# Patient Record
Sex: Female | Born: 1950
Health system: Southern US, Community
[De-identification: ages and names within clinical notes are randomized; demographics above are authoritative.]

## PROBLEM LIST (undated history)

## (undated) DIAGNOSIS — R32 Unspecified urinary incontinence: Secondary | ICD-10-CM

## (undated) DIAGNOSIS — H409 Unspecified glaucoma: Secondary | ICD-10-CM

## (undated) DIAGNOSIS — I10 Essential (primary) hypertension: Secondary | ICD-10-CM

## (undated) DIAGNOSIS — T7840XA Allergy, unspecified, initial encounter: Secondary | ICD-10-CM

## (undated) DIAGNOSIS — Z5189 Encounter for other specified aftercare: Secondary | ICD-10-CM

## (undated) HISTORY — DX: Unspecified glaucoma: H40.9

## (undated) HISTORY — DX: Essential (primary) hypertension: I10

## (undated) HISTORY — DX: Encounter for other specified aftercare: Z51.89

## (undated) HISTORY — DX: Unspecified urinary incontinence: R32

## (undated) HISTORY — DX: Allergy, unspecified, initial encounter: T78.40XA

## (undated) HISTORY — PX: DILATION AND CURETTAGE OF UTERUS: SHX78

---

## 1996-02-13 DIAGNOSIS — Z5189 Encounter for other specified aftercare: Secondary | ICD-10-CM

## 1996-02-13 HISTORY — DX: Encounter for other specified aftercare: Z51.89

## 1996-02-13 HISTORY — PX: ABDOMINAL HYSTERECTOMY: SHX81

## 2011-02-07 ENCOUNTER — Ambulatory Visit (INDEPENDENT_AMBULATORY_CARE_PROVIDER_SITE_OTHER): Payer: 59

## 2011-02-07 DIAGNOSIS — M7750 Other enthesopathy of unspecified foot: Secondary | ICD-10-CM

## 2011-03-05 ENCOUNTER — Other Ambulatory Visit: Payer: Self-pay

## 2011-04-29 ENCOUNTER — Other Ambulatory Visit: Payer: Self-pay | Admitting: Family Medicine

## 2011-04-29 ENCOUNTER — Ambulatory Visit: Payer: 59

## 2011-04-29 ENCOUNTER — Ambulatory Visit (INDEPENDENT_AMBULATORY_CARE_PROVIDER_SITE_OTHER): Payer: 59 | Admitting: Family Medicine

## 2011-04-29 VITALS — BP 125/71 | HR 83 | Temp 97.7°F | Resp 18

## 2011-04-29 DIAGNOSIS — M25529 Pain in unspecified elbow: Secondary | ICD-10-CM

## 2011-04-29 DIAGNOSIS — M25569 Pain in unspecified knee: Secondary | ICD-10-CM

## 2011-04-29 DIAGNOSIS — I959 Hypotension, unspecified: Secondary | ICD-10-CM

## 2011-04-29 DIAGNOSIS — R110 Nausea: Secondary | ICD-10-CM

## 2011-04-29 MED ORDER — MELOXICAM 15 MG PO TABS: 15.0000 mg | ORAL_TABLET | Freq: Every day | ORAL | Status: DC

## 2011-04-29 MED ORDER — ONDANSETRON 4 MG PO TBDP
4.0000 mg | ORAL_TABLET | Freq: Once | ORAL | Status: AC
  Administered 2011-04-29: 4 mg via ORAL

## 2011-04-29 NOTE — Progress Notes (Signed)
Patient Name: Lisa Blair Date of Birth: Oct 01, 1950 Medical Record Number: [id number] Gender: female Date of Encounter: 04/29/2011  History of Present Illness:  Lisa Blair is a 61 y.o. very pleasant female patient who presents with the following:  She was at her mother's funeral today- she died from alzheimer's disease earlier this week.  On the way into the church she fell and hurt her right knee and right elbow.  She was able to walk although her knee is sore. However, her elbow is very sore and she is afraid it may be broken.    Lisa Blair was brought back urgently due to her very low blood pressure- she felt lightheaded and as though she might pass out while in our lobby. She needed to be brought back in a WC. She has not passed out- her fall was caused by her tripping and was not a syncopal episode.    There is no problem list on file for this patient.  No past medical history on file. No past surgical history on file. History  Substance Use Topics  . Smoking status: Not on file  . Smokeless tobacco: Not on file  . Alcohol Use: Not on file   No family history on file. Allergies  Allergen Reactions  . Advil   . Aleve     Medication list has been reviewed and updated.  Review of Systems: As per HPI- otherwise negative.  Physical Examination: Filed Vitals:   04/29/11 1750  BP: 75/53  Pulse: 71  Temp: 97.7 F (36.5 C)  TempSrc: Oral  Resp: 18  recheck BP: 125/ 71, pulse 84  There is no height or weight on file to calculate BMI.  GEN: WDWN, NAD, Non-toxic, A & O x 3- pale and weak, obese HEENT: Atraumatic, Normocephalic. Neck supple. No masses, No LAD. Ears and Nose: No external deformity. CV: RRR, No M/G/R. No JVD. No thrill. No extra heart sounds. PULM: CTA B, no wheezes, crackles, rhonchi. No retractions. No resp. distress. No accessory muscle use. ABD: S, NT, ND, +BS. No rebound. No HSM. EXTR: No c/c/e NEURO Normal gait.  PSYCH: Normally interactive.  Conversant. Not depressed or anxious appearing.  Calm demeanor.  Right knee:  Bruised and sore, but normal ROM, able to do a straight leg raise.   Right elbow: tender at radial head, not able to fully extend elbow, pain with pronation and supination.    Lisa Blair had not eaten since breakfast- seen at 6pm.  She drank 3 gatorades and ate 2 packs of crackers.  She felt a lot better and her BP came up to 125/71.   EKG: NSR, no ST elevation or depression  Results for orders placed in visit on 04/29/11  GLUCOSE, POCT (MANUAL RESULT ENTRY)      Component Value Range   POC Glucose 125      [facility] reading (PRIMARY) by  Dr. Patsy Lager.  Right knee:  No fracture apparent Right  Elbow: suspect radial head fracture  Assessment and Plan: 1. Nausea  ondansetron (ZOFRAN-ODT) disintegrating tablet 4 mg  2. Hypotension  POCT glucose (manual entry)  3. Knee pain  DG Knee Complete 4 Views Left  4. Elbow pain  DG Elbow Complete Right, meloxicam (MOBIC) 15 MG tablet   Protect elbow as suspect radial head fracture- placed long arm splint and sling.  Hypotensive episode likely due to emotional upset and lack of food- she felt much better and was able to walk easily by the end of her  visit.  Will refer to ortho for further treatment of her elbow.  At this time she feels comfortable going home with her husband- will use mobic (which she has tolerated well in the past) as needed for her pain.

## 2011-04-30 NOTE — Progress Notes (Signed)
Addended by: Abbe Amsterdam C on: 04/30/2011 10:37 AM   Modules accepted: Orders

## 2011-07-03 ENCOUNTER — Encounter: Payer: Self-pay | Admitting: Family Medicine

## 2011-08-10 ENCOUNTER — Other Ambulatory Visit: Payer: Self-pay | Admitting: Family Medicine

## 2011-10-10 ENCOUNTER — Ambulatory Visit (INDEPENDENT_AMBULATORY_CARE_PROVIDER_SITE_OTHER): Payer: 59 | Admitting: Family Medicine

## 2011-10-10 ENCOUNTER — Ambulatory Visit: Payer: 59

## 2011-10-10 VITALS — BP 147/83 | HR 87 | Temp 98.0°F | Resp 16 | Ht 71.5 in | Wt 243.0 lb

## 2011-10-10 DIAGNOSIS — M25569 Pain in unspecified knee: Secondary | ICD-10-CM

## 2011-10-10 DIAGNOSIS — M25529 Pain in unspecified elbow: Secondary | ICD-10-CM

## 2011-10-10 DIAGNOSIS — R030 Elevated blood-pressure reading, without diagnosis of hypertension: Secondary | ICD-10-CM

## 2011-10-10 MED ORDER — MELOXICAM 7.5 MG PO TABS: 7.5000 mg | ORAL_TABLET | Freq: Every day | ORAL | Status: AC

## 2011-10-10 NOTE — Progress Notes (Signed)
  Subjective:    Patient ID: Lisa Blair, female    DOB: 01-20-1951, 61 y.o.   MRN: [id number]  HPI Lisa Blair is a 61 y.o. female  Complains of L knee pain - off and on. Difficulty with exercise.  Pain with exercise -most times, sometimes at night.  No known injury.  Pain in lateral knee - near varicose veins area.  No redness, not swelling usually - but occasional fluid on the knee. Helped today with alleve.  No locking/giving way or  Similar sx's last year - helped when took mobic (has had improvement with both 7.5 and 15mg ).    No hx of htn.   Review of Systems  Constitutional: Negative for fever and chills.  Respiratory: Negative for cough, chest tightness and shortness of breath.   Musculoskeletal: Positive for arthralgias.   As above.  No chest pain, dyspnea. No calf pain.     Objective:   Physical Exam  Constitutional: She is oriented to person, place, and time. She appears well-developed and well-nourished. No distress.  Pulmonary/Chest: Effort normal.  Musculoskeletal:       Left knee: She exhibits normal range of motion, no swelling, no effusion, no LCL laxity, normal patellar mobility and no MCL laxity. tenderness found. Lateral joint line (minimal to lateral jt line. from, negative mcmurray, negative ant drawer.) tenderness noted.  Neurological: She is alert and oriented to person, place, and time.  Skin: Skin is warm and dry. No erythema.  Psychiatric: She has a normal mood and affect. Her behavior is normal.      Assessment & Plan:  Lisa Blair is a 61 y.o. female 1. Knee pain  DG Knee 1-2 Views Left  2. Elbow pain  meloxicam (MOBIC) 7.5 MG tablet  3. Elevated blood pressure reading without diagnosis of hypertension      Intermittent pain - ddx oa flair, vs degen meniscal pathology.  Trial of HEP, pool exercise or exercise bike, mobic 7.5mg  qd prn - watch BP, and remains elevated - rtc.  Ok to refill mobic x 1 in few months if only episodic use needed and blood  pressures ok.   rtc precautions.

## 2011-10-10 NOTE — Progress Notes (Signed)
[facility] reading (PRIMARY) by  Dr. Neva Seat:  Minimal lateral DJD, no acute findings. Marland Kitchen

## 2011-10-10 NOTE — Patient Instructions (Signed)
Try mobic once per day as needed.  Work on exercises as discussed. Recheck in next few weeks if not improving.  Watch your blood pressures outside of the office and return to discuss if they are remaining above 140/90.  Return to the clinic or go to the nearest emergency room if any of your symptoms worsen or new symptoms occur.

## 2011-11-15 ENCOUNTER — Encounter: Payer: Self-pay | Admitting: Family Medicine

## 2011-11-23 ENCOUNTER — Ambulatory Visit (INDEPENDENT_AMBULATORY_CARE_PROVIDER_SITE_OTHER): Payer: 59 | Admitting: Family Medicine

## 2011-11-23 ENCOUNTER — Ambulatory Visit: Payer: 59

## 2011-11-23 VITALS — BP 148/82 | HR 73 | Temp 98.5°F | Resp 16 | Ht 71.5 in | Wt 236.0 lb

## 2011-11-23 DIAGNOSIS — R059 Cough, unspecified: Secondary | ICD-10-CM

## 2011-11-23 DIAGNOSIS — R05 Cough: Secondary | ICD-10-CM

## 2011-11-23 DIAGNOSIS — J45909 Unspecified asthma, uncomplicated: Secondary | ICD-10-CM

## 2011-11-23 MED ORDER — ALBUTEROL SULFATE (2.5 MG/3ML) 0.083% IN NEBU
2.5000 mg | INHALATION_SOLUTION | Freq: Once | RESPIRATORY_TRACT | Status: AC
  Administered 2011-11-23: 2.5 mg via RESPIRATORY_TRACT

## 2011-11-23 MED ORDER — PREDNISONE 20 MG PO TABS: ORAL_TABLET | ORAL | Status: DC

## 2011-11-23 MED ORDER — ALBUTEROL SULFATE HFA 108 (90 BASE) MCG/ACT IN AERS: 2.0000 | INHALATION_SPRAY | Freq: Four times a day (QID) | RESPIRATORY_TRACT | Status: DC | PRN

## 2011-11-23 MED ORDER — ALBUTEROL SULFATE (2.5 MG/3ML) 0.083% IN NEBU: 2.5000 mg | INHALATION_SOLUTION | Freq: Once | RESPIRATORY_TRACT | Status: DC

## 2011-11-23 NOTE — Progress Notes (Signed)
Subjective

## 2011-11-23 NOTE — Patient Instructions (Signed)
   The albuterol inhaler as directed    Continue the Mucinex    Drink plenty of fluids    Take the prednisone taper as directed    Return if worse

## 2011-11-24 NOTE — Progress Notes (Signed)
[facility] reading (PRIMARY) by  Dr. Katrinka Blazing for Dr. Alwyn Ren.  CXR:  NAD.

## 2011-12-25 ENCOUNTER — Telehealth: Payer: Self-pay

## 2011-12-25 NOTE — Telephone Encounter (Addendum)
It appears that Eula Listen, PA-C ordered this medication in June #30 with 3 RFs . Yes, we can refill it and I will authorize - #90 with 1 refill ( sig: 1/2 tab hs or 1/2 tab bid for bladder symptoms).

## 2011-12-25 NOTE — Telephone Encounter (Signed)
Recd call from [employer] [facility]. She needs to verify some information on a script from dr mcpherson Please call [facility] at [phone] and use reference number [id number]

## 2011-12-25 NOTE — Telephone Encounter (Signed)
Called [facility] back and they want to know if we can RF pt's oxybutynin. They do not have the oringinal Rx bc it was sent to the local pharmacy, but pt has requested it to be filled by them. They show that Dr Audria Nine had Rxd #90 w/4 RFs on 12/12/11 but then order was discontinued. Dr Audria Nine, do you want pt on this medication and can we RF it, how many RFs? It looks like pt has seen several of our MDs more recently ( only for acute problems) but you saw her for her CPE last Nov 16, 2010. I'm bringing pt's paper chart to 104.

## 2011-12-26 ENCOUNTER — Ambulatory Visit (INDEPENDENT_AMBULATORY_CARE_PROVIDER_SITE_OTHER): Payer: 59 | Admitting: Family Medicine

## 2011-12-26 ENCOUNTER — Encounter: Payer: Self-pay | Admitting: Family Medicine

## 2011-12-26 VITALS — BP 126/86 | HR 79 | Temp 98.6°F | Resp 16 | Ht 71.75 in | Wt 236.0 lb

## 2011-12-26 DIAGNOSIS — Z0000 Encounter for general adult medical examination without abnormal findings: Secondary | ICD-10-CM

## 2011-12-26 DIAGNOSIS — J45909 Unspecified asthma, uncomplicated: Secondary | ICD-10-CM

## 2011-12-26 DIAGNOSIS — Z23 Encounter for immunization: Secondary | ICD-10-CM

## 2011-12-26 DIAGNOSIS — Z862 Personal history of diseases of the blood and blood-forming organs and certain disorders involving the immune mechanism: Secondary | ICD-10-CM

## 2011-12-26 LAB — POCT URINALYSIS DIPSTICK
Bilirubin, UA: NEGATIVE
Leukocytes, UA: NEGATIVE
Nitrite, UA: NEGATIVE
Protein, UA: NEGATIVE
pH, UA: 5.5

## 2011-12-26 LAB — COMPREHENSIVE METABOLIC PANEL
AST: 19 U/L (ref 0–37)
Albumin: 4.4 g/dL (ref 3.5–5.2)
BUN: 11 mg/dL (ref 6–23)
CO2: 25 mEq/L (ref 19–32)
Calcium: 9.4 mg/dL (ref 8.4–10.5)
Chloride: 107 mEq/L (ref 96–112)
Glucose, Bld: 96 mg/dL (ref 70–99)
Potassium: 4.6 mEq/L (ref 3.5–5.3)

## 2011-12-26 LAB — CBC WITH DIFFERENTIAL/PLATELET
Basophils Absolute: 0 10*3/uL (ref 0.0–0.1)
Basophils Relative: 0 % (ref 0–1)
Hemoglobin: 14.5 g/dL (ref 12.0–15.0)
MCHC: 34.2 g/dL (ref 30.0–36.0)
Monocytes Relative: 8 % (ref 3–12)
Neutro Abs: 4.5 10*3/uL (ref 1.7–7.7)
Neutrophils Relative %: 65 % (ref 43–77)
RDW: 13.4 % (ref 11.5–15.5)
WBC: 7 10*3/uL (ref 4.0–10.5)

## 2011-12-26 MED ORDER — MOMETASONE FUROATE 50 MCG/ACT NA SUSP: 2.0000 | Freq: Every day | NASAL | Status: DC

## 2011-12-26 MED ORDER — OXYBUTYNIN CHLORIDE 5 MG PO TABS: ORAL_TABLET | ORAL | Status: DC

## 2011-12-26 MED ORDER — MONTELUKAST SODIUM 10 MG PO TABS: 10.0000 mg | ORAL_TABLET | Freq: Every day | ORAL | Status: DC

## 2011-12-26 NOTE — Patient Instructions (Signed)
Keeping You Healthy  Get These Tests  Blood Pressure- Have your blood pressure checked by your healthcare provider at least once a year.  Normal blood pressure is 120/80.  Weight- Have your body mass index (BMI) calculated to screen for obesity.  BMI is a measure of body fat based on height and weight.  You can calculate your own BMI at www.nhlbisupport.com/bmi/  Cholesterol- Have your cholesterol checked every year.  Diabetes- Have your blood sugar checked every year if you have high blood pressure, high cholesterol, a family history of diabetes or if you are overweight.  Pap Smear- Have a pap smear every 1 to 3 years if you have been sexually active.  If you are older than 65 and recent pap smears have been normal you may not need additional pap smears.  In addition, if you have had a hysterectomy  For benign disease additional pap smears are not necessary.  Mammogram-Yearly mammograms are essential for early detection of breast cancer  Screening for Colon Cancer- Colonoscopy starting at age 50. Screening may begin sooner depending on your family history and other health conditions.  Follow up colonoscopy as directed by your Gastroenterologist.  Screening for Osteoporosis- Screening begins at age 65 with bone density scanning, sooner if you are at higher risk for developing Osteoporosis.  Get these medicines  Calcium with Vitamin D- Your body requires 1200-1500 mg of Calcium a day and 800-1000 IU of Vitamin D a day.  You can only absorb 500 mg of Calcium at a time therefore Calcium must be taken in 2 or 3 separate doses throughout the day.  Hormones- Hormone therapy has been associated with increased risk for certain cancers and heart disease.  Talk to your healthcare provider about if you need relief from menopausal symptoms.  Aspirin- Ask your healthcare provider about taking Aspirin to prevent Heart Disease and Stroke.  Get these Immuniztions  Flu shot- Every fall  Pneumonia  shot- Once after the age of 62; if you are younger ask your healthcare provider if you need a pneumonia shot.  Tetanus- Every ten years.  Zostavax- Once after the age of 62 to prevent shingles.  Take these steps  Don't smoke- Your healthcare provider can help you quit. For tips on how to quit, ask your healthcare provider or go to www.smokefree.gov or call 1-800 QUIT-NOW.  Be physically active- Exercise 5 days a week for a minimum of 30 minutes.  If you are not already physically active, start slow and gradually work up to 30 minutes of moderate physical activity.  Try walking, dancing, bike riding, swimming, etc.  Eat a healthy diet- Eat a variety of healthy foods such as fruits, vegetables, whole grains, low fat milk, low fat cheeses, yogurt, lean meats, chicken, fish, eggs, dried beans, tofu, etc.  For more information go to www.thenutritionsource.org  Dental visit- Brush and floss teeth twice daily; visit your dentist twice a year.  Eye exam- Visit your Optometrist or Ophthalmologist yearly.  Drink alcohol in moderation- Limit alcohol intake to one drink or less a day.  Never drink and drive.  Depression- Your emotional health is as important as your physical health.  If you're feeling down or losing interest in things you normally enjoy, please talk to your healthcare provider.  Seat Belts- can save your life; always wear one  Smoke/Carbon Monoxide detectors- These detectors need to be installed on the appropriate level of your home.  Replace batteries at least once a year.  Violence- If anyone   is threatening or hurting you, please tell your healthcare provider.  Living Will/ Health care power of attorney- Discuss with your healthcare provider and family.   Urinary Incontinence Your doctor wants you to have this information about urinary incontinence. This is the inability to keep urine in your body until you decide to release it. CAUSES  Prostate gland enlargement is a common  cause of urinary incontinence. But there are many different causes for losing urinary control. They include:  Medicines.  Infections.  Prostate problems.  Surgery.  Neurological diseases.  Emotional factors. DIAGNOSIS  Evaluating the cause of incontinence is important in choosing the best treatment. This may require:  An ultrasound exam.  Kidney and bladder X-rays.  Cystoscopy. This is an exam of the bladder using a narrow scope. TREATMENT  For incontinent patients, normal daily hygiene and using changing pads or adult diapers regularly will prevent offensive odors and skin damage from the moisture. Changing your medicines may help control incontinence. Your caregiver may prescribe some medicines to help you regain control. Avoid caffeine. It can over-stimulate the bladder. Use the bathroom regularly. Try about every 2 to 3 hours even if you do not feel the need. Take time to empty your bladder completely. After urinating, wait a minute. Then try to urinate again. External devices used to catch urine or an indwelling urine catheter (Foley catheter) may be needed as well. Some prostate gland problems require surgery to correct. Call your caregiver for more information. Document Released: 03/08/2004 Document Revised: 04/23/2011 Document Reviewed: 03/03/2008 [facility] Patient Information 2013 [place], [place].   Allergic Rhinitis Allergic rhinitis is when the mucous membranes in the nose respond to allergens. Allergens are particles in the air that cause your body to have an allergic reaction. This causes you to release allergic antibodies. Through a chain of events, these eventually cause you to release histamine into the blood stream (hence the use of antihistamines). Although meant to be protective to the body, it is this release that causes your discomfort, such as frequent sneezing, congestion and an itchy runny nose.  CAUSES  The pollen allergens may come from grasses, trees, and  weeds. This is seasonal allergic rhinitis, or "hay fever." Other allergens cause year-round allergic rhinitis (perennial allergic rhinitis) such as house dust mite allergen, pet dander and mold spores.  SYMPTOMS   Nasal stuffiness (congestion).  Runny, itchy nose with sneezing and tearing of the eyes.  There is often an itching of the mouth, eyes and ears. It cannot be cured, but it can be controlled with medications. DIAGNOSIS  If you are unable to determine the offending allergen, skin or blood testing may find it. TREATMENT   Avoid the allergen.  Medications and allergy shots (immunotherapy) can help.  Hay fever may often be treated with antihistamines in pill or nasal spray forms. Antihistamines block the effects of histamine. There are over-the-counter medicines that may help with nasal congestion and swelling around the eyes. Check with your caregiver before taking or giving this medicine. If the treatment above does not work, there are many new medications your caregiver can prescribe. Stronger medications may be used if initial measures are ineffective. Desensitizing injections can be used if medications and avoidance fails. Desensitization is when a patient is given ongoing shots until the body becomes less sensitive to the allergen. Make sure you follow up with your caregiver if problems continue. SEEK MEDICAL CARE IF:   You develop fever (more than 100.5 F (38.1 C).  You develop a  cough that does not stop easily (persistent).  You have shortness of breath.  You start wheezing.  Symptoms interfere with normal daily activities. Document Released: 10/24/2000 Document Revised: 04/23/2011 Document Reviewed: 05/05/2008 [facility] Patient Information 2013 [place], [place].   I think your cough is allergy related and I have prescribed some medication to help reduce those symptoms ( a steroid nasal spray and a medication for asthma/allergies- not an antihistamine).

## 2011-12-26 NOTE — Progress Notes (Signed)
Subjective:    Patient ID: Lisa Blair, female    DOB: [birth year], 61 y.o.   MRN: [id number]  HPI  This 61 y.o. Cauc female is here for CPE. Chronic problems include Allergic bronchitis and   urinary frequency which are controlled on current medications. She seldom has rhinorrhea or  postnasal drip. COugh is productive of thick white sputum; these symptoms are exacerbated  by exposure to dampness, especially if she works in the garden during summer months.   Pt is  Married and is not working (retired). Averages 2 glasses of wine/week; no tobacco use.   HCM: PAP-UTD per pt (abnl- followed by GYN)            MMG- UTD per pt (abnl- followed by GYN)            DEXA- 2006- normal            CRS- 2006- normal.    Review of Systems  Constitutional: Negative.   Eyes: Negative.   Respiratory: Positive for cough and wheezing.   Gastrointestinal: Negative.   Genitourinary: Positive for urgency and frequency.  Musculoskeletal: Negative.   Skin: Negative.   Neurological: Positive for headaches.  Hematological: Negative.   Psychiatric/Behavioral: Negative.        Objective:   Physical Exam  Nursing note and vitals reviewed. Constitutional: She is oriented to person, place, and time. She appears well-developed and well-nourished. No distress.  HENT:  Head: Normocephalic and atraumatic.  Right Ear: Hearing, tympanic membrane, external ear and ear canal normal.  Left Ear: Hearing, tympanic membrane, external ear and ear canal normal.  Nose: Nose normal. No mucosal edema, rhinorrhea, nasal deformity or septal deviation. Right sinus exhibits no maxillary sinus tenderness and no frontal sinus tenderness. Left sinus exhibits no maxillary sinus tenderness and no frontal sinus tenderness.  Mouth/Throat: Uvula is midline and mucous membranes are normal. No oral lesions. Normal dentition. No dental caries. Posterior oropharyngeal erythema present. No oropharyngeal exudate or posterior oropharyngeal  edema.  Eyes: Conjunctivae normal, EOM and lids are normal. Pupils are equal, round, and reactive to light. No scleral icterus.  Fundoscopic exam:      The right eye shows red reflex.      The left eye shows red reflex. Neck: Normal range of motion. Neck supple. No thyromegaly present.  Cardiovascular: Normal rate, regular rhythm, normal heart sounds and intact distal pulses.  Exam reveals no gallop and no friction rub.   No murmur heard. Pulmonary/Chest: Effort normal and breath sounds normal. No respiratory distress. She has no wheezes. She has no rales. Right breast exhibits no inverted nipple, no mass, no nipple discharge, no skin change and no tenderness. Left breast exhibits no inverted nipple, no mass, no nipple discharge, no skin change and no tenderness. Breasts are symmetrical.  Abdominal: Soft. Normal aorta and bowel sounds are normal. She exhibits no distension, no abdominal bruit, no pulsatile midline mass and no mass. There is no hepatosplenomegaly. There is no tenderness. There is no guarding and no CVA tenderness. No hernia.  Genitourinary: Rectal exam shows external hemorrhoid. Rectal exam shows no fissure, no mass, no tenderness and anal tone normal. Guaiac positive stool.  Musculoskeletal: Normal range of motion. She exhibits no edema and no tenderness.  Lymphadenopathy:    She has no cervical adenopathy.  Neurological: She is alert and oriented to person, place, and time. She has normal reflexes. No cranial nerve deficit. She exhibits normal muscle tone. Coordination normal.  Skin: Skin is  warm and dry. No rash noted. No erythema. No pallor.  Psychiatric: She has a normal mood and affect. Her behavior is normal. Judgment and thought content normal.          Assessment & Plan:   1. Routine general medical examination at a health care facility  IFOBT POC (occult bld, rslt in office)- Positive (possibly due to large hemorrhoids); pt given hemosure to do at home and return to  office. Colonoscopy is due in 2014.  Comprehensive metabolic panel, POCT urinalysis dipstick  2. Allergic bronchitis  Continue current medications  3. History of anemia  CBC with Differential  4. Need for prophylactic vaccination and inoculation against influenza  Flu vaccine greater than or equal to 3yo preservative free IM

## 2011-12-26 NOTE — Telephone Encounter (Signed)
Sent Rx to [facility]

## 2011-12-27 ENCOUNTER — Encounter: Payer: Self-pay | Admitting: Radiology

## 2011-12-27 NOTE — Progress Notes (Signed)
Quick Note:  Please notify pt that results are normal.   Provide pt with copy of labs. ______ 

## 2011-12-31 ENCOUNTER — Encounter: Payer: Self-pay | Admitting: Family Medicine

## 2011-12-31 DIAGNOSIS — R3915 Urgency of urination: Secondary | ICD-10-CM | POA: Insufficient documentation

## 2011-12-31 DIAGNOSIS — Z862 Personal history of diseases of the blood and blood-forming organs and certain disorders involving the immune mechanism: Secondary | ICD-10-CM | POA: Insufficient documentation

## 2011-12-31 DIAGNOSIS — Z87898 Personal history of other specified conditions: Secondary | ICD-10-CM | POA: Insufficient documentation

## 2011-12-31 DIAGNOSIS — J45909 Unspecified asthma, uncomplicated: Secondary | ICD-10-CM | POA: Insufficient documentation

## 2011-12-31 DIAGNOSIS — Z8742 Personal history of other diseases of the female genital tract: Secondary | ICD-10-CM | POA: Insufficient documentation

## 2011-12-31 DIAGNOSIS — E669 Obesity, unspecified: Secondary | ICD-10-CM | POA: Insufficient documentation

## 2012-02-08 ENCOUNTER — Ambulatory Visit (INDEPENDENT_AMBULATORY_CARE_PROVIDER_SITE_OTHER): Payer: 59 | Admitting: Family Medicine

## 2012-02-08 ENCOUNTER — Encounter: Payer: Self-pay | Admitting: Family Medicine

## 2012-02-08 VITALS — BP 130/83 | HR 97 | Temp 97.1°F | Resp 16 | Ht 70.5 in | Wt 235.0 lb

## 2012-02-08 DIAGNOSIS — J209 Acute bronchitis, unspecified: Secondary | ICD-10-CM

## 2012-02-08 MED ORDER — AMOXICILLIN 500 MG PO CAPS: ORAL_CAPSULE | ORAL | Status: DC

## 2012-02-08 NOTE — Patient Instructions (Addendum)
Acute Bronchitis You have acute bronchitis. This means you have a chest cold. The airways in your lungs are red and sore (inflamed). Acute means it is sudden onset.  CAUSES Bronchitis is most often caused by the same virus that causes a cold. SYMPTOMS   Body aches.  Chest congestion.  Chills.  Cough.  Fever.  Shortness of breath.  Sore throat. TREATMENT  Acute bronchitis is usually treated with rest, fluids, and medicines for relief of fever or cough. Most symptoms should go away after a few days or a week. Increased fluids may help thin your secretions and will prevent dehydration. Your caregiver may give you an inhaler to improve your symptoms. The inhaler reduces shortness of breath and helps control cough. You can take over-the-counter pain relievers or cough medicine to decrease coughing, pain, or fever. A cool-air vaporizer may help thin bronchial secretions and make it easier to clear your chest. Antibiotics are usually not needed but can be prescribed if you smoke, are seriously ill, have chronic lung problems, are elderly, or you are at higher risk for developing complications.Allergies and asthma can make bronchitis worse. Repeated episodes of bronchitis may cause longstanding lung problems. Avoid smoking and secondhand smoke.Exposure to cigarette smoke or irritating chemicals will make bronchitis worse. If you are a cigarette smoker, consider using nicotine gum or skin patches to help control withdrawal symptoms. Quitting smoking will help your lungs heal faster. Recovery from bronchitis is often slow, but you should start feeling better after 2 to 3 days. Cough from bronchitis frequently lasts for 3 to 4 weeks. To prevent another bout of acute bronchitis:  Quit smoking.  Wash your hands frequently to get rid of viruses or use a hand sanitizer.  Avoid other people with cold or virus symptoms.  Try not to touch your hands to your mouth, nose, or eyes. SEEK IMMEDIATE  MEDICAL CARE IF:  You develop increased fever, chills, or chest pain.  You have severe shortness of breath or bloody sputum.  You develop dehydration, fainting, repeated vomiting, or a severe headache.  You have no improvement after 1 week of treatment or you get worse. MAKE SURE YOU:   Understand these instructions.  Will watch your condition.  Will get help right away if you are not doing well or get worse. Document Released: 03/08/2004 Document Revised: 04/23/2011 Document Reviewed: 05/24/2010 [facility] Patient Information 2013 [place], [place].   You had a chest xray in October which was normal. If these symptoms do not improve, return for re-evaluation and a repeat xray. Also, a humidifier in your bedroom and anywhere else in the home where the air may be dry could reduce your symptoms.

## 2012-02-08 NOTE — Progress Notes (Signed)
S:  This 61 y.o. Cauc female is here for evaluation of cough which started > 1week ago. She and her husband travelled to [place], [place] for business. She had RX: Amoxicillin to take "in case a bad tooth started to abscess". She started taking the antibiotic 1 week ago and felt better within 5 days. She ran out of medication because husband took 5 of her capsules for an infected cut on his hand. She was also exposed to family members who had "colds". Her cough, which is productive, is not associated w/ fever, chest pain or SOB. She has not really been using Albuterol MDI (it seems to make her cough worse).  She will be travelling to [place] on Feb 15, 2012 and "will recuperate on the beach in the warm salty air".  PMHx and Soc Hx reviewed.  O:  Filed Vitals:   02/08/12 1425  BP: 130/83  Pulse: 97  Temp: 97.1 F (36.2 C)  Resp: 16   GEN: In NAD; WN,WD. HEENT: [place]/AT. EOMI w/ clear conj and sclerae. EACs normal. TMs dull. Post ph erythematous w/o lesions. Nasal mucosa w/o edema or rhinorrhea. NECK: Supple w/o LAN or TMG. COR: RRR; no m/g/r. No edema. LUNGS: CTA w/o wheezes or rhonchi. SKIN: W&D; no rashes or pallor. NEURO: A&O x 3; CNs intact. Nonfocal.  Xray review: CXR in Oct 2013- no active cardiopulmonary disease.  A/P:  1. Bronchitis, acute      RX: Amoxicillin 500 mg  2 capsules bid for 7 days. Take other prescription meds as directed.  Continue other measures and OTC medication for cough. Consider using a humidifier in home.

## 2012-04-26 ENCOUNTER — Other Ambulatory Visit: Payer: Self-pay | Admitting: Family Medicine

## 2012-08-29 ENCOUNTER — Other Ambulatory Visit: Payer: Self-pay | Admitting: Physician Assistant

## 2013-01-07 ENCOUNTER — Other Ambulatory Visit: Payer: Self-pay

## 2013-01-07 MED ORDER — OXYBUTYNIN CHLORIDE 5 MG PO TABS: ORAL_TABLET | ORAL | Status: DC

## 2013-03-06 ENCOUNTER — Other Ambulatory Visit: Payer: Self-pay | Admitting: Family Medicine

## 2013-03-20 ENCOUNTER — Ambulatory Visit (INDEPENDENT_AMBULATORY_CARE_PROVIDER_SITE_OTHER): Payer: 59 | Admitting: Family Medicine

## 2013-03-20 ENCOUNTER — Encounter: Payer: Self-pay | Admitting: Family Medicine

## 2013-03-20 VITALS — BP 144/80 | HR 64 | Temp 98.4°F | Resp 16 | Ht 71.0 in | Wt 236.2 lb

## 2013-03-20 DIAGNOSIS — Z1212 Encounter for screening for malignant neoplasm of rectum: Secondary | ICD-10-CM

## 2013-03-20 DIAGNOSIS — E669 Obesity, unspecified: Secondary | ICD-10-CM

## 2013-03-20 DIAGNOSIS — M7632 Iliotibial band syndrome, left leg: Secondary | ICD-10-CM

## 2013-03-20 DIAGNOSIS — Z1211 Encounter for screening for malignant neoplasm of colon: Secondary | ICD-10-CM

## 2013-03-20 DIAGNOSIS — Z0000 Encounter for general adult medical examination without abnormal findings: Secondary | ICD-10-CM

## 2013-03-20 DIAGNOSIS — M629 Disorder of muscle, unspecified: Secondary | ICD-10-CM

## 2013-03-20 DIAGNOSIS — E559 Vitamin D deficiency, unspecified: Secondary | ICD-10-CM

## 2013-03-20 LAB — POCT URINALYSIS DIPSTICK
Bilirubin, UA: NEGATIVE
Glucose, UA: NEGATIVE
Ketones, UA: NEGATIVE
Leukocytes, UA: NEGATIVE
Nitrite, UA: NEGATIVE
Protein, UA: NEGATIVE
Spec Grav, UA: 1.025
Urobilinogen, UA: 0.2
pH, UA: 5.5

## 2013-03-20 LAB — CBC WITH DIFFERENTIAL/PLATELET
Basophils Absolute: 0 K/uL (ref 0.0–0.1)
Basophils Relative: 0 % (ref 0–1)
Eosinophils Absolute: 0.1 K/uL (ref 0.0–0.7)
Eosinophils Relative: 2 % (ref 0–5)
HCT: 40.5 % (ref 36.0–46.0)
Hemoglobin: 13.8 g/dL (ref 12.0–15.0)
Lymphocytes Relative: 28 % (ref 12–46)
Lymphs Abs: 1.7 K/uL (ref 0.7–4.0)
MCH: 29.5 pg (ref 26.0–34.0)
MCHC: 34.1 g/dL (ref 30.0–36.0)
MCV: 86.5 fL (ref 78.0–100.0)
Monocytes Absolute: 0.4 K/uL (ref 0.1–1.0)
Monocytes Relative: 7 % (ref 3–12)
Neutro Abs: 3.6 K/uL (ref 1.7–7.7)
Neutrophils Relative %: 63 % (ref 43–77)
Platelets: 190 K/uL (ref 150–400)
RBC: 4.68 MIL/uL (ref 3.87–5.11)
RDW: 13.4 % (ref 11.5–15.5)
WBC: 5.8 K/uL (ref 4.0–10.5)

## 2013-03-20 LAB — IFOBT (OCCULT BLOOD): IFOBT: NEGATIVE

## 2013-03-20 MED ORDER — OXYBUTYNIN CHLORIDE 5 MG PO TABS: ORAL_TABLET | ORAL | Status: DC

## 2013-03-20 NOTE — Progress Notes (Signed)
Subjective:    Patient ID: Lisa Blair, female    DOB: 27-Jan-1951, 63 y.o.   MRN: [id number]  HPI  This 63 y.o. Cauc female is here for CPE. She had TAH in 1998 for benign reasons. She has no complaints re: pelvic pain or GI symptoms.  HCM: CRS- Due in 2016.           MMG- Nov 2013 (negative).           IMM- Current.           DEXA- Has not had.  Patient Active Problem List   Diagnosis Date Noted  . Allergic bronchitis 12/31/2011  . History of anemia 12/31/2011  . History of abnormal cervical Pap smear 12/31/2011  . History of abnormal mammogram 12/31/2011  . Obesity (BMI 30.0-34.9) 12/31/2011  . Urinary urgency 12/31/2011   Prior to Admission medications   Medication Sig Start Date End Date Taking? Authorizing Provider  aspirin 81 MG tablet Take 81 mg by mouth daily.   Yes Historical Provider, MD  Cholecalciferol (VITAMIN D PO) Take by mouth daily.   Yes Historical Provider, MD  Multiple Vitamin (MULTIVITAMIN) tablet Take 1 tablet by mouth daily.   Yes Historical Provider, MD  oxybutynin (DITROPAN) 5 MG tablet TAKE 1/2 TABLET BY MOUTH AT BEDTIME OR 1/2 TABLET TWICE A DAY FOR BLADDER. 03/20/13  Yes Barton Fanny, MD  albuterol (PROVENTIL HFA;VENTOLIN HFA) 108 (90 BASE) MCG/ACT inhaler Inhale 2 puffs into the lungs every 6 (six) hours as needed for wheezing. 11/23/11   Posey Boyer, MD    PMHx, Surg Hx, Soc and [place]in [place] reviewed.   Review of Systems  Constitutional: Negative.   HENT: Positive for nosebleeds.        Hx of nosebleeds but much less often now.  Eyes: Negative.        Plans to sch follow-up w/ specialist.  Respiratory: Negative.   Cardiovascular: Negative.   Gastrointestinal: Negative.   Endocrine: Negative.   Genitourinary: Positive for urgency and frequency.       Chronic urinary incontinence; Oxybutynin is effective.  Musculoskeletal: Positive for arthralgias. Negative for back pain, gait problem, joint swelling and myalgias.       L knee- lateral  aspect has intermittent tenderness w/o redness or swelling.  Skin: Negative.   Allergic/Immunologic: Negative.   Neurological: Positive for headaches. Negative for dizziness, syncope, facial asymmetry, speech difficulty, weakness and numbness.       Infrequent migraines.  Hematological: Negative.   Psychiatric/Behavioral: Negative.        Objective:   Physical Exam  Nursing note and vitals reviewed. Constitutional: She is oriented to person, place, and time. Vital signs are normal. She appears well-developed and well-nourished. No distress.  HENT:  Head: Normocephalic and atraumatic.  Right Ear: Hearing, tympanic membrane, external ear and ear canal normal.  Left Ear: Hearing, tympanic membrane, external ear and ear canal normal.  Nose: Nose normal. No mucosal edema, rhinorrhea, nasal deformity or septal deviation.  Mouth/Throat: Uvula is midline, oropharynx is clear and moist and mucous membranes are normal. No oral lesions. Normal dentition.  Eyes: Conjunctivae, EOM and lids are normal. Pupils are equal, round, and reactive to light. No scleral icterus.  Fundoscopic exam:      The right eye shows no papilledema. The right eye shows red reflex.       The left eye shows no papilledema. The left eye shows red reflex.  Neck: Trachea normal, normal range of  motion and full passive range of motion without pain. Neck supple. No JVD present. No spinous process tenderness and no muscular tenderness present. Carotid bruit is not present. No mass and no thyromegaly present.  Cardiovascular: Normal rate, regular rhythm, S1 normal, S2 normal, normal heart sounds and normal pulses.   No extrasystoles are present. PMI is not displaced.  Exam reveals no gallop and no friction rub.   No murmur heard. Pulmonary/Chest: Effort normal and breath sounds normal. No respiratory distress. She has no decreased breath sounds. She has no wheezes. She has no rhonchi. She has no rales. Right breast exhibits no  inverted nipple, no mass, no nipple discharge, no skin change and no tenderness. Left breast exhibits no inverted nipple, no mass, no nipple discharge, no skin change and no tenderness. Breasts are symmetrical.  Abdominal: Soft. Normal appearance and bowel sounds are normal. She exhibits no distension, no abdominal bruit, no pulsatile midline mass and no mass. There is no hepatosplenomegaly. There is no tenderness. There is no guarding and no CVA tenderness. No hernia.  Genitourinary: Rectum normal. Rectal exam shows no external hemorrhoid, no fissure, no mass, no tenderness and anal tone normal. Guaiac negative stool.  NEFG; pelvic/ PAP deferred.  Musculoskeletal: Normal range of motion. She exhibits no edema and no tenderness.  Pt indicates tender area on lateral aspect of L knee- no abnormality found on exam.  Lymphadenopathy:       Head (right side): No submental, no submandibular, no tonsillar, no posterior auricular and no occipital adenopathy present.       Head (left side): No submental, no submandibular, no tonsillar, no posterior auricular and no occipital adenopathy present.    She has no cervical adenopathy.    She has no axillary adenopathy.       Right: No inguinal and no supraclavicular adenopathy present.       Left: No inguinal and no supraclavicular adenopathy present.  Neurological: She is alert and oriented to person, place, and time. She has normal strength and normal reflexes. She displays no atrophy and no tremor. No cranial nerve deficit or sensory deficit. She exhibits normal muscle tone. She displays a negative Romberg sign. Coordination and gait normal.  Skin: Skin is warm, dry and intact. No ecchymosis, no lesion, no petechiae and no rash noted. She is not diaphoretic. No cyanosis or erythema. No pallor. Nails show no clubbing.  Psychiatric: She has a normal mood and affect. Her speech is normal and behavior is normal. Judgment and thought content normal. Cognition and  memory are normal.    Results for orders placed in visit on 03/20/13  IFOBT (OCCULT BLOOD)      Result Value Range   IFOBT Negative    POCT URINALYSIS DIPSTICK      Result Value Range   Color, UA yellow     Clarity, UA clear     Glucose, UA neg     Bilirubin, UA neg     Ketones, UA neg     Spec Grav, UA 1.025     Blood, UA tr-intact     pH, UA 5.5     Protein, UA neg     Urobilinogen, UA 0.2     Nitrite, UA neg     Leukocytes, UA Negative         Assessment & Plan:  Routine general medical examination at a health care facility - Plan: IFOBT POC (occult bld, rslt in office), TSH, POCT urinalysis dipstick, T3, Free, T4,  Free, COMPLETE METABOLIC PANEL WITH GFR, Lipid panel, CBC with Differential  Iliotibial band syndrome of left side  Unspecified vitamin D deficiency - Plan: Vit D  25 hydroxy (rtn osteoporosis monitoring)  Screening for colorectal cancer - Plan: Ambulatory referral to Gastroenterology, IFOBT POC (occult bld, rslt in office)  Obesity (BMI 30.0-34.9)  Meds ordered this encounter  Medications  . oxybutynin (DITROPAN) 5 MG tablet    Sig: TAKE 1/2 TABLET BY MOUTH AT BEDTIME OR 1/2 TABLET TWICE A DAY FOR BLADDER.    Dispense:  30 tablet    Refill:  11

## 2013-03-20 NOTE — Patient Instructions (Signed)
Keeping You Healthy  Get These Tests  Blood Pressure- Have your blood pressure checked by your healthcare provider at least once a year.  Normal blood pressure is 120/80.  Weight- Have your body mass index (BMI) calculated to screen for obesity.  BMI is a measure of body fat based on height and weight.  You can calculate your own BMI at www.nhlbisupport.com/bmi/  Cholesterol- Have your cholesterol checked every year.  Diabetes- Have your blood sugar checked every year if you have high blood pressure, high cholesterol, a family history of diabetes or if you are overweight.  Pap Smear- Have a pap smear every 1 to 3 years if you have been sexually active.  If you are older than 65 and recent pap smears have been normal you may not need additional pap smears.  In addition, if you have had a hysterectomy  For benign disease additional pap smears are not necessary.  Mammogram-Yearly mammograms are essential for early detection of breast cancer  Screening for Colon Cancer- Colonoscopy starting at age 50. Screening may begin sooner depending on your family history and other health conditions.  Follow up colonoscopy as directed by your Gastroenterologist.  Screening for Osteoporosis- Screening begins at age 65 with bone density scanning, sooner if you are at higher risk for developing Osteoporosis.  Get these medicines  Calcium with Vitamin D- Your body requires 1200-1500 mg of Calcium a day and 800-1000 IU of Vitamin D a day.  You can only absorb 500 mg of Calcium at a time therefore Calcium must be taken in 2 or 3 separate doses throughout the day.  Hormones- Hormone therapy has been associated with increased risk for certain cancers and heart disease.  Talk to your healthcare provider about if you need relief from menopausal symptoms.  Aspirin- Ask your healthcare provider about taking Aspirin to prevent Heart Disease and Stroke.  Get these Immuniztions  Flu shot- Every fall  Pneumonia  shot- Once after the age of 65; if you are younger ask your healthcare provider if you need a pneumonia shot.  Tetanus- Every ten years.  Zostavax- Once after the age of 60 to prevent shingles.  Take these steps  Don't smoke- Your healthcare provider can help you quit. For tips on how to quit, ask your healthcare provider or go to www.smokefree.gov or call 1-800 QUIT-NOW.  Be physically active- Exercise 5 days a week for a minimum of 30 minutes.  If you are not already physically active, start slow and gradually work up to 30 minutes of moderate physical activity.  Try walking, dancing, bike riding, swimming, etc.  Eat a healthy diet- Eat a variety of healthy foods such as fruits, vegetables, whole grains, low fat milk, low fat cheeses, yogurt, lean meats, chicken, fish, eggs, dried beans, tofu, etc.  For more information go to www.thenutritionsource.org  Dental visit- Brush and floss teeth twice daily; visit your dentist twice a year.  Eye exam- Visit your Optometrist or Ophthalmologist yearly.  Drink alcohol in moderation- Limit alcohol intake to one drink or less a day.  Never drink and drive.  Depression- Your emotional health is as important as your physical health.  If you're feeling down or losing interest in things you normally enjoy, please talk to your healthcare provider.  Seat Belts- can save your life; always wear one  Smoke/Carbon Monoxide detectors- These detectors need to be installed on the appropriate level of your home.  Replace batteries at least once a year.  Violence- If anyone   is threatening or hurting you, please tell your healthcare provider.  Living Will/ Health care power of attorney- Discuss with your healthcare provider and family.    Iliotibial Band Syndrome Iliotibial band syndrome is pain in the outer, lower thigh. The pain is caused by an inflammation of the iliotibial band. This is a band of thick fibrous tissue that runs down the outside of the  thigh. The iliotibial band begins at the hip. It extends to the outer side of the shin bone (tibia) just below the knee joint. The band works with the thigh muscles. Together they provide stability to the outside of the knee joint. Iliotibial band syndrome occurs when there is inflammation to this band of tissue. This is typically due to over use and not due to an injury. The irritation usually occurs over the outside of the knee joint, at the the end of the thigh bone (femur). The iliotibial band crosses bone and muscle at this point. Between these structures is a cushioning sac (bursa). The bursa should make possible a smooth gliding motion. However, when inflamed, the iliotibial band does not glide easily. When inflamed, there is pain with motion of the knee. Usually the pain worsens with continued movement and the pain goes away with rest. This problem usually arises when there is a sudden increase in sports activities involving your legs. Running, and playing soccer or basketball are examples of activities causing this. Others who are prone to iliotibial band syndrome include individuals with mechanical problems such as leg length differences, abnormality of walking, bowed legs etc. HOME CARE INSTRUCTIONS   Apply ice to the injured area:  Put ice in a plastic bag.  Place a towel between your skin and the bag.  Leave the ice on for 20 minutes, 2 3 times a day.  Limit excessive training or eliminate training until pain goes away.  While pain is present, you may use gentle range of motion. Do not resume regular use until instructed by your health care provider. Begin use gradually. Do not increase activity to the point of pain. If pain does develop, decrease activity and continue the above measures. Gradually increase activities that do not cause discomfort. Do this until you finally achieve normal use.  Perform low-impact activities while pain is present. Wear proper footwear.  Only take  over-the-counter or prescription medicines for pain, discomfort, or fever as directed by your health care provider. SEEK MEDICAL CARE IF:   Your pain increases or pain is not controlled with medications.  You develop new, unexplained symptoms, or an increase of the symptoms that brought you to your health care provider.  Your pain and symptoms are not improving or are getting worse. Document Released: 07/21/2001 Document Revised: 11/19/2012 Document Reviewed: 08/28/2012 [facility] Patient Information 2014 [place], [place].

## 2013-03-21 LAB — COMPLETE METABOLIC PANEL WITH GFR
ALK PHOS: 60 U/L (ref 39–117)
ALT: 18 U/L (ref 0–35)
AST: 17 U/L (ref 0–37)
Albumin: 3.8 g/dL (ref 3.5–5.2)
BUN: 14 mg/dL (ref 6–23)
CO2: 22 mEq/L (ref 19–32)
Calcium: 9.3 mg/dL (ref 8.4–10.5)
Chloride: 107 mEq/L (ref 96–112)
Creat: 0.61 mg/dL (ref 0.50–1.10)
GFR, Est African American: >89 mL/min
GFR, Est Non African American: >89 mL/min
GLUCOSE: 96 mg/dL (ref 70–99)
POTASSIUM: 4.5 meq/L (ref 3.5–5.3)
SODIUM: 142 meq/L (ref 135–145)
TOTAL PROTEIN: 6.7 g/dL (ref 6.0–8.3)
Total Bilirubin: 0.6 mg/dL (ref 0.2–1.2)

## 2013-03-21 LAB — LIPID PANEL
CHOL/HDL RATIO: 4.1 ratio
Cholesterol: 180 mg/dL (ref 0–200)
HDL: 44 mg/dL (ref >39)
LDL CALC: 112 mg/dL — AB (ref 0–99)
Triglycerides: 118 mg/dL (ref <150)
VLDL: 24 mg/dL (ref 0–40)

## 2013-03-21 LAB — T3, FREE: T3, Free: 3.3 pg/mL (ref 2.3–4.2)

## 2013-03-21 LAB — TSH: TSH: 2.888 u[IU]/mL (ref 0.350–4.500)

## 2013-03-21 LAB — T4, FREE: Free T4: 0.92 ng/dL (ref 0.80–1.80)

## 2013-03-21 LAB — VITAMIN D 25 HYDROXY (VIT D DEFICIENCY, FRACTURES): VIT D 25 HYDROXY: 40 ng/mL (ref 30–89)

## 2013-03-22 NOTE — Progress Notes (Signed)
Quick Note:  Please advise pt regarding following labs...  All your labs look great. Thyroid function and blood counts are normal. Chemistries, kidney and liver function results are normal. Vitamin D level is in the low normal range. Lipids look good but LDL ("bad") cholesterol is above normal. Nutrition changes and regular physical activity will get this number below 100.  Copy to pt. ______

## 2013-08-31 ENCOUNTER — Encounter: Payer: Self-pay | Admitting: Family Medicine

## 2013-11-26 IMAGING — CR DG KNEE 1-2V*L*
2 series · 2 of 2 positions shown · non-contrast
Comparison: None.

CLINICAL DATA: Knee pain.

LEFT KNEE - 1-2 VIEW

[AP]
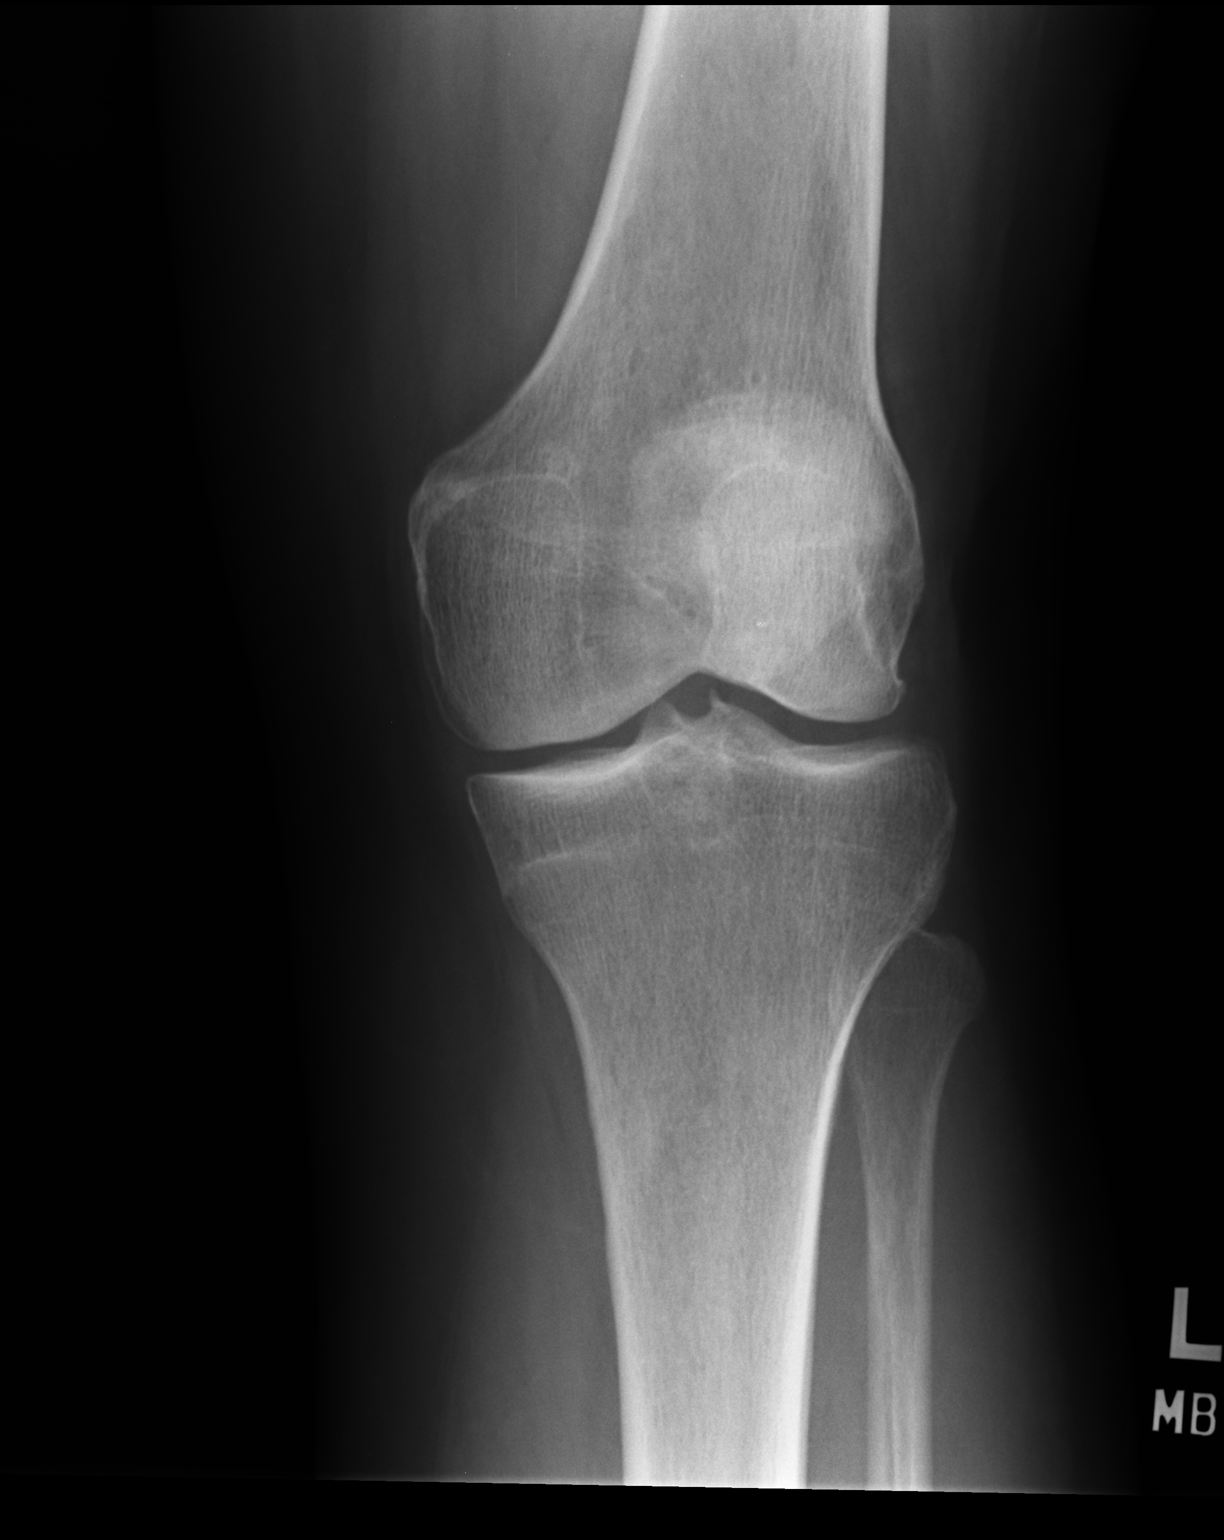

[lateral]
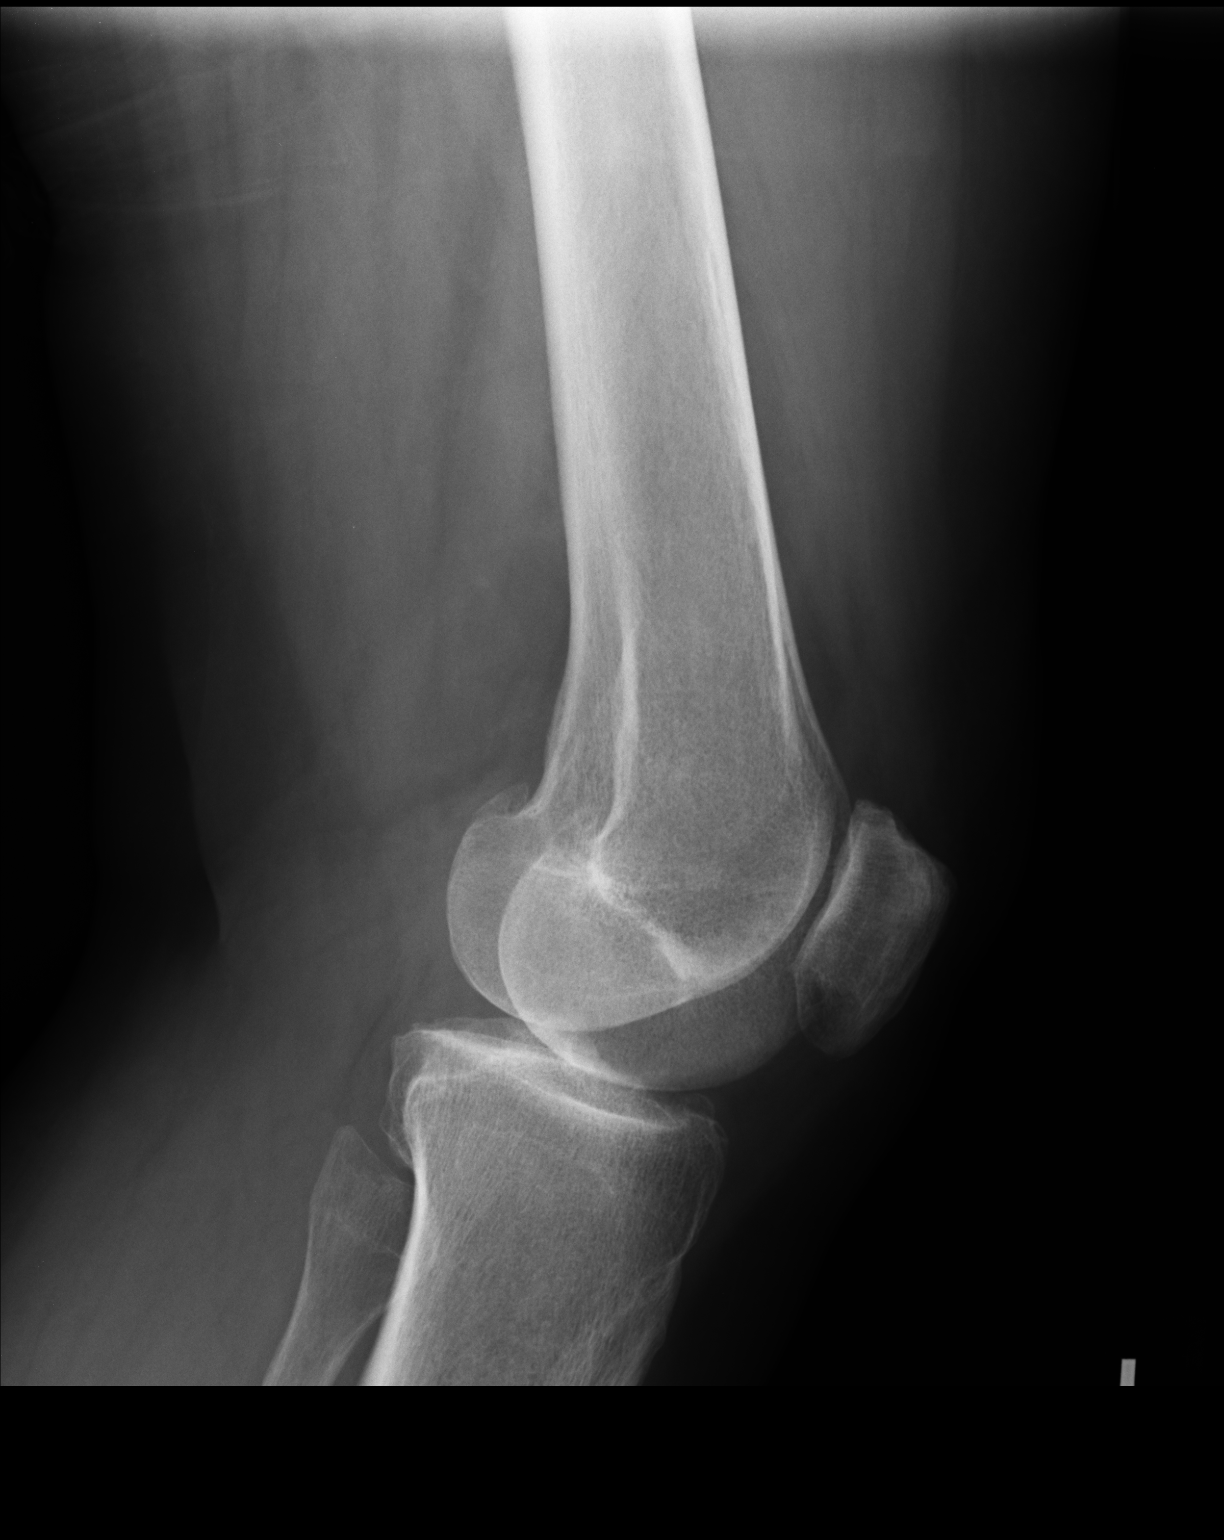

[2 of 2 positions shown; findings below may reference images not displayed]

FINDINGS: Mild degenerative changes are noted in the patellofemoral
compartment of the knee joint.  There is slight narrowing of the
medial compartment of the knee.  The knee is located.  There is no
significant joint effusion. Mineralization is within normal limits.
IMPRESSION: 1.  Mild degenerative change.
2.  No acute abnormality.

Clinically significant discrepancy from primary report, if
provided: None

## 2014-01-09 IMAGING — CR DG CHEST 2V
2 series · 2 of 2 positions shown · non-contrast
Comparison: None.

CLINICAL DATA: Cough.

CHEST - 2 VIEW

[PA]
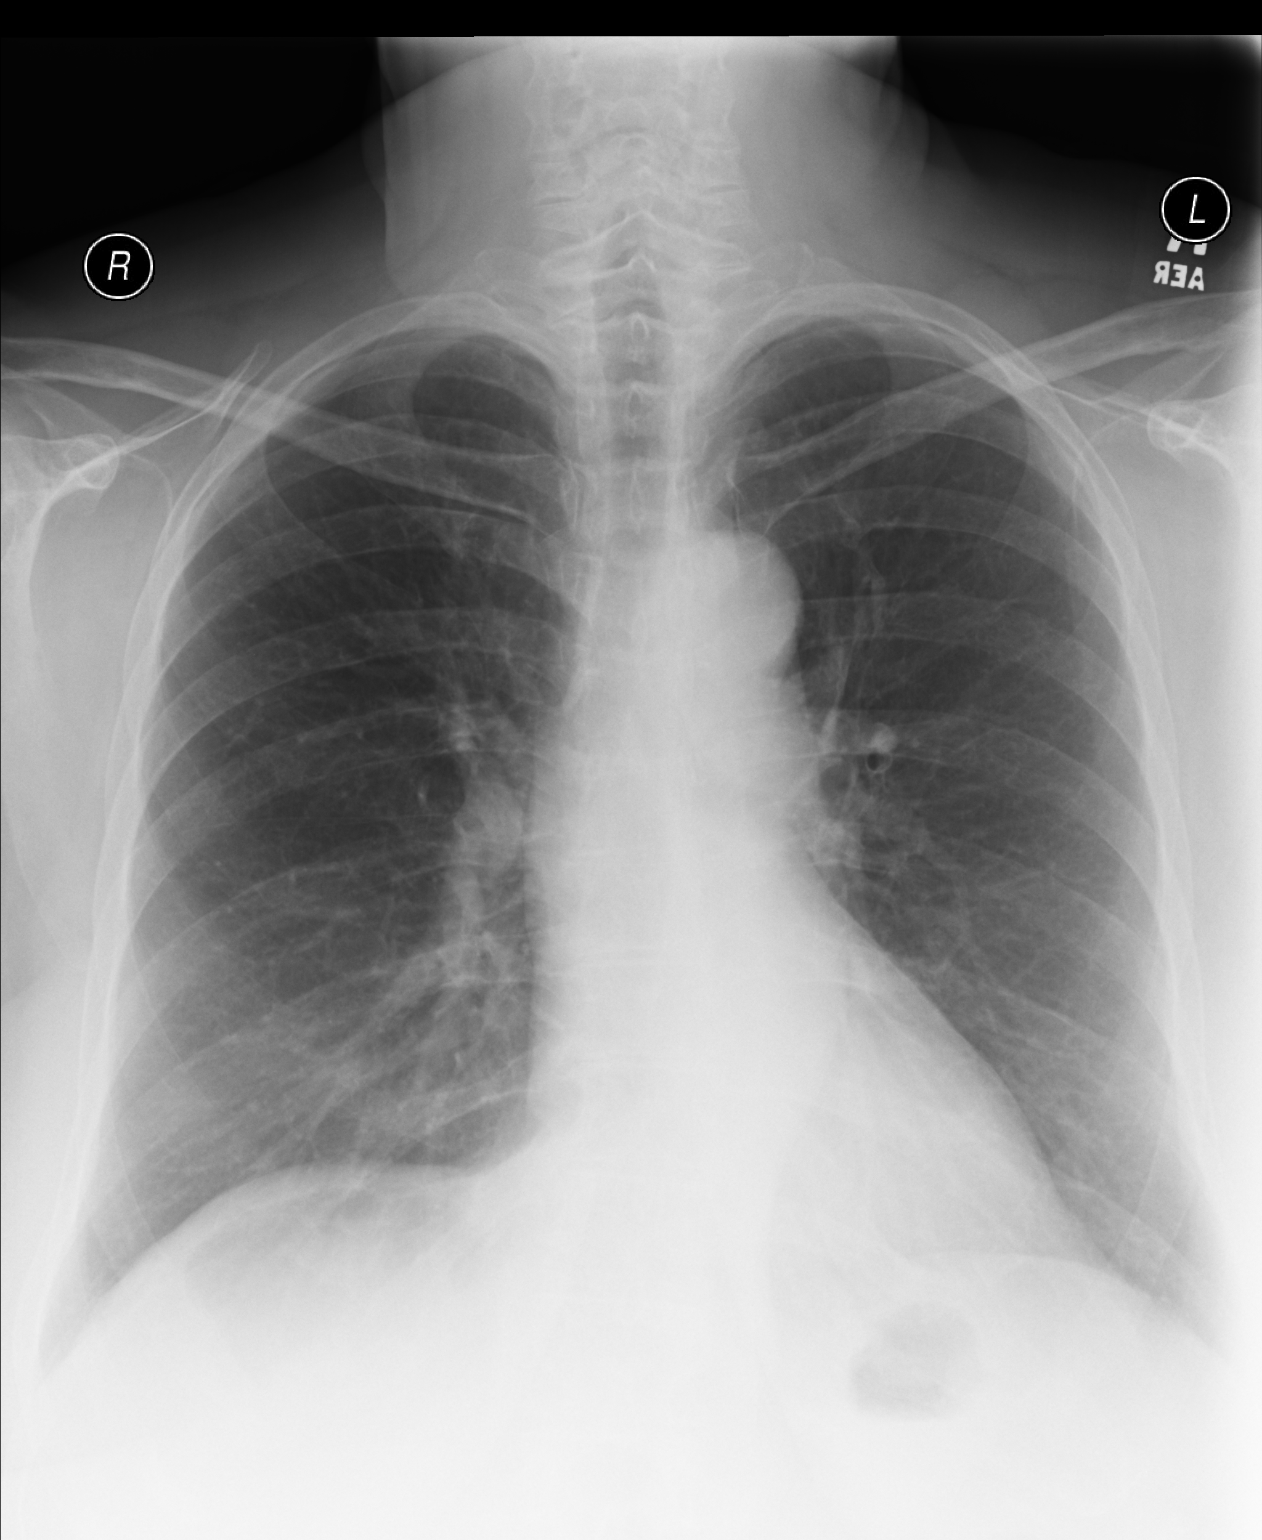

[lateral]
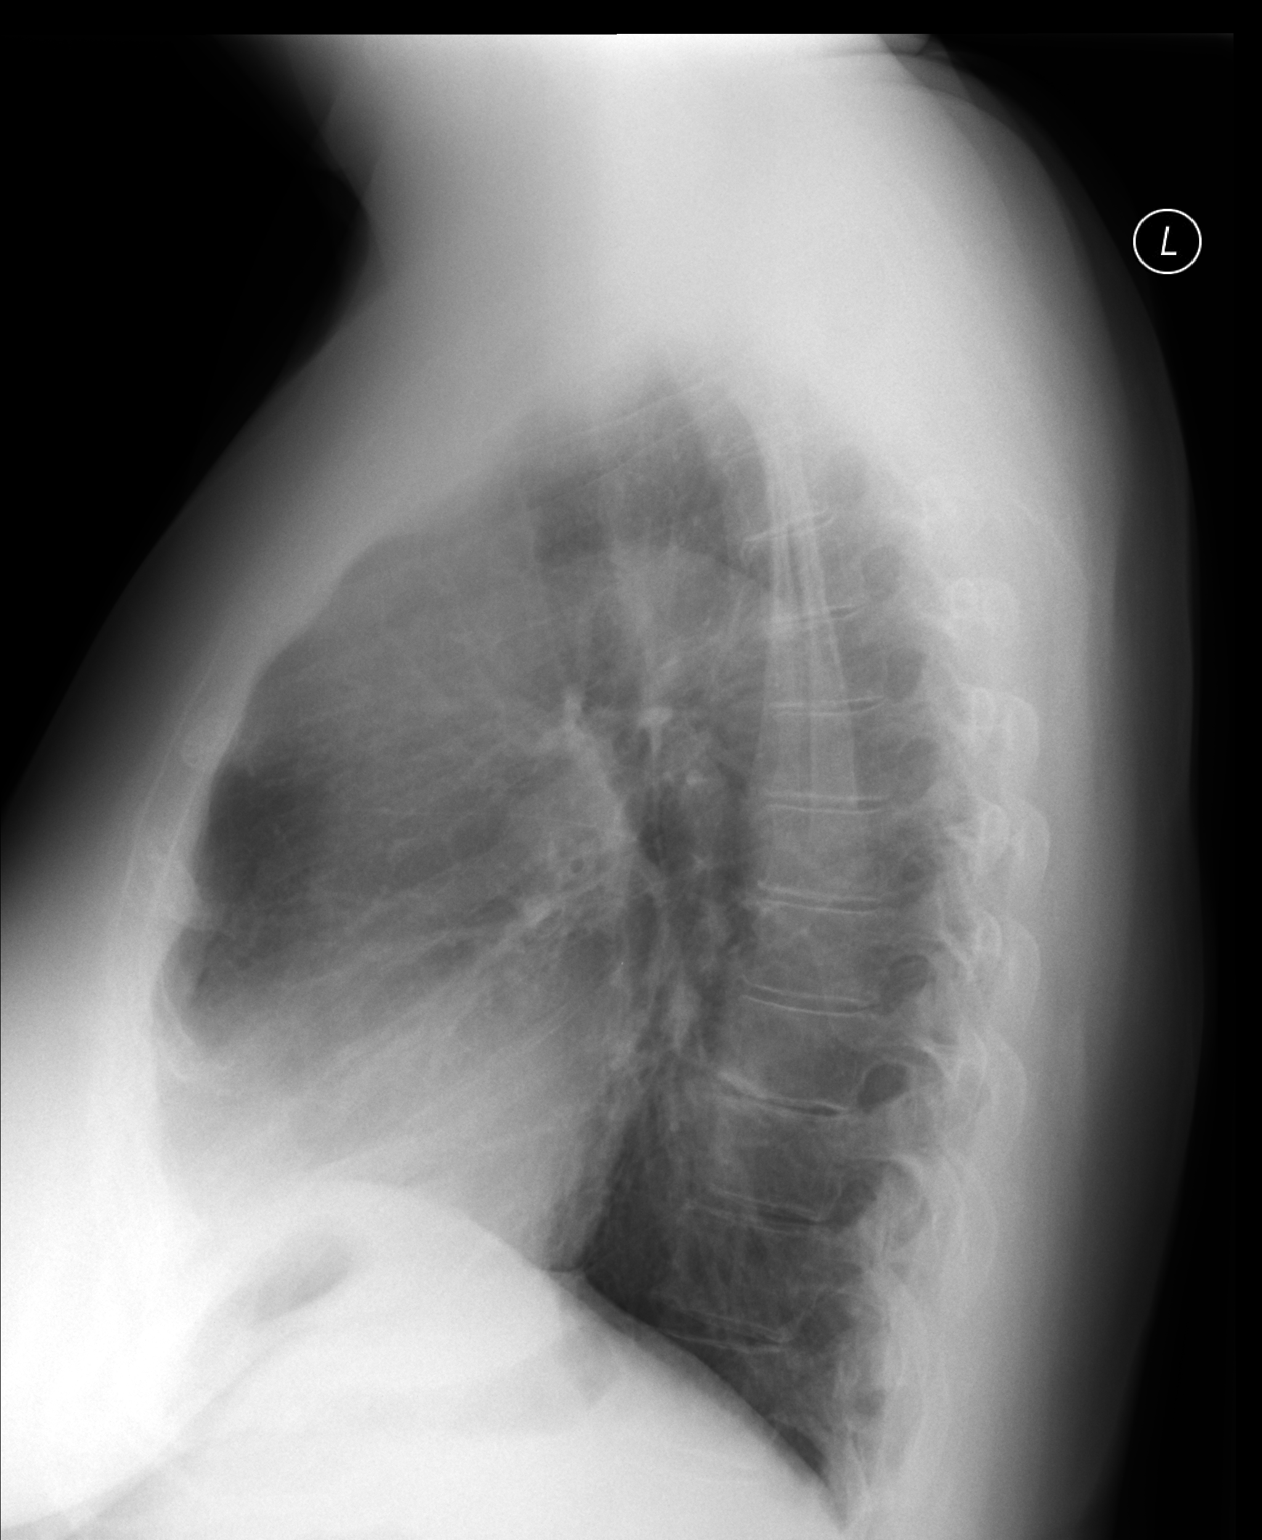

[2 of 2 positions shown; findings below may reference images not displayed]

FINDINGS: The heart size and mediastinal contours are within
normal limits.  Both lungs are clear.  The visualized skeletal
structures are unremarkable.
IMPRESSION: No active cardiopulmonary disease.

## 2014-04-10 ENCOUNTER — Other Ambulatory Visit: Payer: Self-pay | Admitting: Family Medicine

## 2014-07-08 ENCOUNTER — Encounter: Payer: Self-pay | Admitting: Family Medicine

## 2014-07-08 ENCOUNTER — Encounter (INDEPENDENT_AMBULATORY_CARE_PROVIDER_SITE_OTHER): Payer: Medicare HMO | Admitting: Family Medicine

## 2014-07-08 VITALS — BP 147/81 | HR 81 | Temp 98.0°F | Resp 16 | Ht 71.5 in | Wt 239.0 lb

## 2014-07-08 DIAGNOSIS — Z760 Encounter for issue of repeat prescription: Secondary | ICD-10-CM | POA: Diagnosis not present

## 2014-07-08 DIAGNOSIS — R030 Elevated blood-pressure reading, without diagnosis of hypertension: Secondary | ICD-10-CM

## 2014-07-08 LAB — CBC WITH DIFFERENTIAL/PLATELET
Basophils Absolute: 0 10*3/uL (ref 0.0–0.1)
Basophils Relative: 0 % (ref 0–1)
EOS ABS: 0.2 10*3/uL (ref 0.0–0.7)
Eosinophils Relative: 3 % (ref 0–5)
HEMATOCRIT: 42.3 % (ref 36.0–46.0)
HEMOGLOBIN: 14.2 g/dL (ref 12.0–15.0)
LYMPHS PCT: 30 % (ref 12–46)
Lymphs Abs: 1.7 10*3/uL (ref 0.7–4.0)
MCH: 29.7 pg (ref 26.0–34.0)
MCHC: 33.6 g/dL (ref 30.0–36.0)
MCV: 88.5 fL (ref 78.0–100.0)
MPV: 11.3 fL (ref 8.6–12.4)
Monocytes Absolute: 0.4 10*3/uL (ref 0.1–1.0)
Monocytes Relative: 8 % (ref 3–12)
NEUTROS PCT: 59 % (ref 43–77)
Neutro Abs: 3.3 10*3/uL (ref 1.7–7.7)
PLATELETS: 207 10*3/uL (ref 150–400)
RBC: 4.78 MIL/uL (ref 3.87–5.11)
RDW: 14 % (ref 11.5–15.5)
WBC: 5.6 10*3/uL (ref 4.0–10.5)

## 2014-07-08 LAB — COMPREHENSIVE METABOLIC PANEL
ALT: 16 U/L (ref 0–35)
AST: 15 U/L (ref 0–37)
Albumin: 4 g/dL (ref 3.5–5.2)
Alkaline Phosphatase: 59 U/L (ref 39–117)
BILIRUBIN TOTAL: 0.6 mg/dL (ref 0.2–1.2)
BUN: 12 mg/dL (ref 6–23)
CO2: 29 mEq/L (ref 19–32)
CREATININE: 0.55 mg/dL (ref 0.50–1.10)
Calcium: 9.3 mg/dL (ref 8.4–10.5)
Chloride: 106 mEq/L (ref 96–112)
Glucose, Bld: 91 mg/dL (ref 70–99)
POTASSIUM: 4.5 meq/L (ref 3.5–5.3)
Sodium: 142 mEq/L (ref 135–145)
Total Protein: 6.8 g/dL (ref 6.0–8.3)

## 2014-07-08 LAB — LIPID PANEL
CHOL/HDL RATIO: 4.5 ratio
CHOLESTEROL: 166 mg/dL (ref 0–200)
HDL: 37 mg/dL — ABNORMAL LOW (ref >=46)
LDL Cholesterol: 94 mg/dL (ref 0–99)
TRIGLYCERIDES: 177 mg/dL — AB (ref <150)
VLDL: 35 mg/dL (ref 0–40)

## 2014-07-08 LAB — TSH: TSH: 2.643 u[IU]/mL (ref 0.350–4.500)

## 2014-07-08 LAB — HEMOGLOBIN A1C
Hgb A1c MFr Bld: 5.9 % — ABNORMAL HIGH (ref <5.7)
Mean Plasma Glucose: 123 mg/dL — ABNORMAL HIGH (ref <117)

## 2014-07-08 MED ORDER — OXYBUTYNIN CHLORIDE 5 MG PO TABS: ORAL_TABLET | ORAL | Status: DC

## 2014-07-13 NOTE — Progress Notes (Signed)
This pt had her encounter today with Philis Fendt, PA-C.  This chart was opened by me in error. This encounter was created in error - please disregard.

## 2014-07-13 NOTE — Progress Notes (Signed)
   07/13/2014 at 7:28 PM  Lisa Blair / DOB: [birth year] / MRN: [id number]  The patient has Allergic bronchitis; History of anemia; History of abnormal cervical Pap smear; History of abnormal mammogram; Obesity (BMI 30.0-34.9); and Urinary urgency on her problem list.  SUBJECTIVE  Chief complaint: Medication Refill and Error  Patient here for a refill of her oxybutynin 5 mg tabs.  She takes this for urge incontentinence and reports good results. She reports having less urinary urgency and is able to fully void upon urinating.  Denies dysuria, hematuria and frequency.  She denies any side effects and overall she feels well.   She reports she was originally scheduled for blood work today and would like to have her labs drawn. She denies vision changes and HA.      She  has a past medical history of Blood transfusion without reported diagnosis.    Medications reviewed and updated by myself where necessary, and exist elsewhere in the encounter.   Lisa Blair is allergic to codeine; ibuprofen; and naproxen sodium. She  reports that she has never smoked. She does not have any smokeless tobacco history on file. She reports that she drinks alcohol. She reports that she does not use illicit drugs. She  has no sexual activity history on file. The patient  has past surgical history that includes Abdominal hysterectomy (1998).  Her family history includes Cancer in her father and sister; Multiple sclerosis in her son.  Review of Systems  Constitutional: Negative for fever.  Respiratory: Negative for cough.   Cardiovascular: Negative for chest pain.  Genitourinary: Negative for hematuria and flank pain.  Musculoskeletal: Negative for myalgias.  Skin: Negative for itching.  Neurological: Negative for dizziness.    OBJECTIVE  Her  height is 5' 11.5" (1.816 m) and weight is 239 lb (108.41 kg). Her temperature is 98 F (36.7 C). Her blood pressure is 147/81 and her pulse is 81. Her respiration is 16 and  oxygen saturation is 90%.  The patient's body mass index is 32.87 kg/(m^2).  Physical Exam  Constitutional: She appears well-developed and well-nourished. No distress.  Cardiovascular: Normal rate, regular rhythm and normal heart sounds.   Respiratory: Effort normal and breath sounds normal.  GI: Bowel sounds are normal. She exhibits no distension. There is no tenderness.  Skin: Skin is warm and dry. She is not diaphoretic.  Psychiatric: She has a normal mood and affect.    No results found for this or any previous visit (from the past 24 hour(s)).  ASSESSMENT & PLAN  Lisa Blair was seen today for medication refill and error.  Diagnoses and all orders for this visit:  Elevated blood pressure (not hypertension) Orders: -     Comprehensive metabolic panel -     Cancel: POCT CBC -     Hemoglobin A1c -     TSH -     Lipid panel -     CBC with Differential/Platelet  Medication refill Orders: -     oxybutynin (DITROPAN) 5 MG tablet; TAKE 1/2 tablet at bedtime, or 1/2 tablet twice daily for bladder. PATIENT NEEDS OFFICE VISIT FOR ADDITIONAL REFILLS    The patient was advised to call or come back to clinic if she does not see an improvement in symptoms, or worsens with the above plan.   Lisa Blair, MHS, PA-C [facility] 07/13/2014 7:28 PM

## 2014-09-10 ENCOUNTER — Telehealth: Payer: Self-pay | Admitting: Family Medicine

## 2014-09-10 NOTE — Telephone Encounter (Signed)
Left message on both machines that her appt with [facility] had been moved from 11-22-14 to 11-17-14 at 9:15

## 2014-11-17 ENCOUNTER — Ambulatory Visit (INDEPENDENT_AMBULATORY_CARE_PROVIDER_SITE_OTHER): Payer: Medicare HMO | Admitting: Family Medicine

## 2014-11-17 ENCOUNTER — Encounter: Payer: Self-pay | Admitting: Family Medicine

## 2014-11-17 VITALS — BP 145/95 | HR 69 | Temp 98.3°F | Resp 16 | Ht 72.0 in | Wt 238.0 lb

## 2014-11-17 DIAGNOSIS — Z23 Encounter for immunization: Secondary | ICD-10-CM

## 2014-11-17 DIAGNOSIS — Z780 Asymptomatic menopausal state: Secondary | ICD-10-CM | POA: Insufficient documentation

## 2014-11-17 DIAGNOSIS — I10 Essential (primary) hypertension: Secondary | ICD-10-CM | POA: Insufficient documentation

## 2014-11-17 DIAGNOSIS — R7303 Prediabetes: Secondary | ICD-10-CM | POA: Insufficient documentation

## 2014-11-17 DIAGNOSIS — R7309 Other abnormal glucose: Secondary | ICD-10-CM | POA: Diagnosis not present

## 2014-11-17 DIAGNOSIS — Z1211 Encounter for screening for malignant neoplasm of colon: Secondary | ICD-10-CM | POA: Diagnosis not present

## 2014-11-17 DIAGNOSIS — Z0000 Encounter for general adult medical examination without abnormal findings: Secondary | ICD-10-CM

## 2014-11-17 LAB — HEMOGLOBIN A1C
Hgb A1c MFr Bld: 6 % — ABNORMAL HIGH
Mean Plasma Glucose: 126 mg/dL — ABNORMAL HIGH

## 2014-11-17 MED ORDER — LISINOPRIL 10 MG PO TABS: 10.0000 mg | ORAL_TABLET | Freq: Every day | ORAL | Status: DC

## 2014-11-17 NOTE — Patient Instructions (Addendum)
I will check your A1c today and see how it looks.   If not improved we can start metformin to help prevent onset of diabetes if you like Let's also start on a low dose of lisinopril for your blood pressure  You do NOT have a cervix- you do not have to continue pap smears  I will refer you for a bone density scan and also to see GI to discuss colonoscopy   You got your pneumonia shot today- you can get the other pneumonia shot (pneumovax) in a year  Please keep an eye on your blood pressure when you can- we would like to see you no higher than 130/85

## 2014-11-17 NOTE — Progress Notes (Signed)
[facility] [address], [place] [address] [phone]  Date:  11/17/2014   Name:  Lisa Blair   DOB:  08-Oct-1950   MRN:  [id number]  PCP:  Ellsworth Lennox, MD    Chief Complaint: Annual Exam   History of Present Illness:  Lisa Blair is a 64 y.o. very pleasant female patient who presents with the following:  Here today for a CPE- she was last seen here 03/2013 mammo 2015 Colon 2006 She has NOT yet had a pneumonia vaccine  Last labs done in May of this year- her glucose was a bit high, OW looked ok.  Needs an A1c She is taking ditropan for her bladder, and is walking more by using her fitbit.  She is working on 5,000 steps a day- this is also helping with her bladder  She did have severe fibroids and then had a hysterectomy performed in 1998 approx- she does have one ovary. Unsure if she has a cervix She did have a bone density scan a few years ago- close to 10 years ago  She has noted that her BP is borderline over the last few months- hoped that exercise would help.  However she notes that her SBP is generally 140- 150 when she donates blood Last pap about 3 years ago- no history of abnl pap  Noted to have pre-diabetes at last labs  Lab Results  Component Value Date   HGBA1C 5.9* 07/08/2014   She was not aware of this- discussed with her today, will check and follow-up  Wt Readings from Last 3 Encounters:  11/17/14 238 lb (107.956 kg)  07/08/14 239 lb (108.41 kg)  03/20/13 236 lb 3.2 oz (107.14 kg)     BP Readings from Last 3 Encounters:  11/17/14 160/88  07/08/14 147/81  03/20/13 144/80     Patient Active Problem List   Diagnosis Date Noted  . Allergic bronchitis 12/31/2011  . History of anemia 12/31/2011  . History of abnormal cervical Pap smear 12/31/2011  . History of abnormal mammogram 12/31/2011  . Obesity (BMI 30.0-34.9) 12/31/2011  . Urinary urgency 12/31/2011    Past Medical History  Diagnosis Date  . Blood  transfusion without reported diagnosis     Past Surgical History  Procedure Laterality Date  . Abdominal hysterectomy  1998    Social History  Substance Use Topics  . Smoking status: Never Smoker   . Smokeless tobacco: None  . Alcohol Use: Yes     Comment: 2 drinks    Family History  Problem Relation Age of Onset  . Cancer Father   . Multiple sclerosis Son   . Cancer Sister     Allergies  Allergen Reactions  . Codeine Other (See Comments)    SEVERE HEADACHES  . Ibuprofen Other (See Comments)    UNABLE TO SLEEP  . Naproxen Sodium     LIGHTHEADNESS    Medication list has been reviewed and updated.  Current Outpatient Prescriptions on File Prior to Visit  Medication Sig Dispense Refill  . aspirin 81 MG tablet Take 81 mg by mouth daily.    Marland Kitchen oxybutynin (DITROPAN) 5 MG tablet TAKE 1/2 tablet at bedtime, or 1/2 tablet twice daily for bladder. PATIENT NEEDS OFFICE VISIT FOR ADDITIONAL REFILLS 30 tablet 11  . Multiple Vitamin (MULTIVITAMIN) tablet Take 1 tablet by mouth daily.    . [DISCONTINUED] oxybutynin (DITROPAN) 5 MG tablet TAKE 1/2 TABLET BY MOUTH AT BEDTIME OR 1/2 TABLET TWICE  A DAY FOR BLADDER 30 tablet 3   No current facility-administered medications on file prior to visit.    Review of Systems:  As per HPI- otherwise negative.   Physical Examination: Filed Vitals:   11/17/14 0921  BP: 169/84  Pulse: 69  Temp: 98.3 F (36.8 C)  Resp: 16   Filed Vitals:   11/17/14 0921  Height: 6' (1.829 m)  Weight: 238 lb (107.956 kg)   Body mass index is 32.27 kg/(m^2). Ideal Body Weight: Weight in (lb) to have BMI = 25: 183.9  GEN: WDWN, NAD, Non-toxic, A & O x 3, obese, looks well HEENT: Atraumatic, Normocephalic. Neck supple. No masses, No LAD.  Bilateral TM wnl, oropharynx normal.  PEERL,EOMI.   Ears and Nose: No external deformity. CV: RRR, No M/G/R. No JVD. No thrill. No extra heart sounds. PULM: CTA B, no wheezes, crackles, rhonchi. No retractions. No  resp. distress. No accessory muscle use. ABD: S, NT, ND. No rebound. No HSM. EXTR: No c/c/e NEURO Normal gait.  PSYCH: Normally interactive. Conversant. Not depressed or anxious appearing.  Calm demeanor.  Breast: normal exam, no masses/ dimpling/ discharge Pelvic: normal, no vaginal lesions or discharge. No cervix or uterus present    Assessment and Plan: Physical exam  Need for prophylactic vaccination and inoculation against influenza - Plan: Flu Vaccine QUAD 36+ mos IM  Essential hypertension - Plan: lisinopril (PRINIVIL,ZESTRIL) 10 MG tablet  Immunization due - Plan: Pneumococcal conjugate vaccine 13-valent IM, CANCELED: Pneumococcal polysaccharide vaccine 23-valent greater than or equal to 2yo subcutaneous/IM  Pre-diabetes - Plan: Hemoglobin A1c  Post-menopausal - Plan: DG Bone Density  Screening for colon cancer - Plan: Ambulatory referral to Gastroenterology  prevar as she is now 52 Referral for dexa and colon Start on low dose of lisinopril for HTN Check a1c  Signed Lamar Blinks, MD

## 2014-11-18 ENCOUNTER — Encounter: Payer: Self-pay | Admitting: Internal Medicine

## 2014-11-18 ENCOUNTER — Encounter: Payer: Self-pay | Admitting: Family Medicine

## 2014-11-22 ENCOUNTER — Other Ambulatory Visit: Payer: Self-pay

## 2014-11-22 ENCOUNTER — Encounter: Payer: Medicare HMO | Admitting: Family Medicine

## 2014-11-22 DIAGNOSIS — E2839 Other primary ovarian failure: Secondary | ICD-10-CM

## 2014-11-23 ENCOUNTER — Other Ambulatory Visit: Payer: Self-pay

## 2014-11-23 DIAGNOSIS — E2839 Other primary ovarian failure: Secondary | ICD-10-CM

## 2014-12-17 DIAGNOSIS — H2513 Age-related nuclear cataract, bilateral: Secondary | ICD-10-CM | POA: Diagnosis not present

## 2014-12-17 DIAGNOSIS — H5702 Anisocoria: Secondary | ICD-10-CM | POA: Diagnosis not present

## 2014-12-17 DIAGNOSIS — H40003 Preglaucoma, unspecified, bilateral: Secondary | ICD-10-CM | POA: Diagnosis not present

## 2014-12-31 DIAGNOSIS — Z1231 Encounter for screening mammogram for malignant neoplasm of breast: Secondary | ICD-10-CM | POA: Diagnosis not present

## 2014-12-31 LAB — HM MAMMOGRAPHY

## 2015-01-11 ENCOUNTER — Ambulatory Visit (AMBULATORY_SURGERY_CENTER): Payer: Self-pay

## 2015-01-11 VITALS — Ht 72.0 in | Wt 240.0 lb

## 2015-01-11 DIAGNOSIS — Z1211 Encounter for screening for malignant neoplasm of colon: Secondary | ICD-10-CM

## 2015-01-11 MED ORDER — NA SULFATE-K SULFATE-MG SULF 17.5-3.13-1.6 GM/177ML PO SOLN: ORAL | Status: DC

## 2015-01-11 NOTE — Progress Notes (Signed)
Pt states she had a colonoscopy done over 10 years ago in [place], but everything was normal.Will try to get results.   Per pt, no allergies to soy or egg products.Pt not taking any weight loss meds or using  O2 at home.

## 2015-01-12 ENCOUNTER — Encounter: Payer: Self-pay | Admitting: Internal Medicine

## 2015-01-13 HISTORY — PX: COLONOSCOPY: SHX174

## 2015-01-17 ENCOUNTER — Encounter: Payer: Self-pay | Admitting: Family Medicine

## 2015-01-25 ENCOUNTER — Encounter: Payer: Self-pay | Admitting: Internal Medicine

## 2015-01-25 ENCOUNTER — Ambulatory Visit (AMBULATORY_SURGERY_CENTER): Payer: Medicare HMO | Admitting: Internal Medicine

## 2015-01-25 VITALS — BP 146/92 | HR 64 | Temp 98.6°F | Resp 27 | Ht 72.0 in | Wt 240.0 lb

## 2015-01-25 DIAGNOSIS — Z1211 Encounter for screening for malignant neoplasm of colon: Secondary | ICD-10-CM | POA: Diagnosis not present

## 2015-01-25 DIAGNOSIS — I10 Essential (primary) hypertension: Secondary | ICD-10-CM | POA: Diagnosis not present

## 2015-01-25 DIAGNOSIS — D123 Benign neoplasm of transverse colon: Secondary | ICD-10-CM | POA: Diagnosis not present

## 2015-01-25 DIAGNOSIS — D124 Benign neoplasm of descending colon: Secondary | ICD-10-CM | POA: Diagnosis not present

## 2015-01-25 MED ORDER — SODIUM CHLORIDE 0.9 % IV SOLN: 500.0000 mL | INTRAVENOUS | Status: DC

## 2015-01-25 NOTE — Patient Instructions (Signed)
YOU HAD AN ENDOSCOPIC PROCEDURE TODAY AT THE [facility]:   Refer to the procedure report that was given to you for any specific questions about what was found during the examination.  If the procedure report does not answer your questions, please call your gastroenterologist to clarify.  If you requested that your care partner not be given the details of your procedure findings, then the procedure report has been included in a sealed envelope for you to review at your convenience later.  YOU SHOULD EXPECT: Some feelings of bloating in the abdomen. Passage of more gas than usual.  Walking can help get rid of the air that was put into your GI tract during the procedure and reduce the bloating. If you had a lower endoscopy (such as a colonoscopy or flexible sigmoidoscopy) you may notice spotting of blood in your stool or on the toilet paper. If you underwent a bowel prep for your procedure, you may not have a normal bowel movement for a few days.  Please Note:  You might notice some irritation and congestion in your nose or some drainage.  This is from the oxygen used during your procedure.  There is no need for concern and it should clear up in a day or so.  SYMPTOMS TO REPORT IMMEDIATELY:   Following lower endoscopy (colonoscopy or flexible sigmoidoscopy):  Excessive amounts of blood in the stool  Significant tenderness or worsening of abdominal pains  Swelling of the abdomen that is new, acute  Fever of 100F or higher    For urgent or emergent issues, a gastroenterologist can be reached at any hour by calling [phone].   DIET: Your first meal following the procedure should be a small meal and then it is ok to progress to your normal diet. Heavy or fried foods are harder to digest and may make you feel nauseous or bloated.  Likewise, meals heavy in dairy and vegetables can increase bloating.  Drink plenty of fluids but you should avoid alcoholic beverages for 24  hours.  ACTIVITY:  You should plan to take it easy for the rest of today and you should NOT DRIVE or use heavy machinery until tomorrow (because of the sedation medicines used during the test).    FOLLOW UP: Our staff will call the number listed on your records the next business day following your procedure to check on you and address any questions or concerns that you may have regarding the information given to you following your procedure. If we do not reach you, we will leave a message.  However, if you are feeling well and you are not experiencing any problems, there is no need to return our call.  We will assume that you have returned to your regular daily activities without incident.  If any biopsies were taken you will be contacted by phone or by letter within the next 1-3 weeks.  Please call us at [phone] if you have not heard about the biopsies in 3 weeks.    SIGNATURES/CONFIDENTIALITY: You and/or your care partner have signed paperwork which will be entered into your electronic medical record.  These signatures attest to the fact that that the information above on your After Visit Summary has been reviewed and is understood.  Full responsibility of the confidentiality of this discharge information lies with you and/or your care-partner   Information on polyps and hemorrhoids given to you today        

## 2015-01-25 NOTE — Progress Notes (Signed)
  [facility] Post-op Note  Patient: Lisa Blair  Procedure(s) Performed: colonoscopy  Patient Location: [facility] - Recovery Area  Anesthesia Type: Deep Sedation/Propofol  Level of Consciousness: awake, oriented and patient cooperative  Airway and Oxygen Therapy: Patient Spontanous Breathing  Post-op Pain: none  Post-op Assessment:  Post-op Vital signs reviewed, Patient's Cardiovascular Status Stable, Respiratory Function Stable, Patent Airway, No signs of Nausea or vomiting and Pain level controlled  Post-op Vital Signs: Reviewed and stable  Complications: No apparent anesthesia complications  Azoria Abbett E 9:14 AM

## 2015-01-25 NOTE — Op Note (Signed)
[facility]  [address] [place], [id number]   COLONOSCOPY PROCEDURE REPORT  PATIENT: Donnel, Stachowicz  MR#: [id number] BIRTHDATE: 07/25/1950 , 75  yrs. old GENDER: female ENDOSCOPIST: Eustace Quail, MD REFERRED [id number] Copland, M.D. PROCEDURE DATE:  01/25/2015 PROCEDURE:   Colonoscopy, screening and Colonoscopy with snare polypectomy X3 First Screening Colonoscopy - Avg.  risk and is 50 yrs.  old or older - No.  Prior Negative Screening - Now for repeat screening. 10 or more years since last screening  History of Adenoma - Now for follow-up colonoscopy & has been > or = to 3 yrs.  N/A  Polyps removed today? Yes ASA CLASS:   Class II INDICATIONS:Screening for colonic neoplasia and Colorectal Neoplasm Risk Assessment for this procedure is average risk. . Reports negative index colonoscopy in [place] 10+ years ago MEDICATIONS: Monitored anesthesia care and Propofol 200 mg IV  DESCRIPTION OF PROCEDURE:   After the risks benefits and alternatives of the procedure were thoroughly explained, informed consent was obtained.  The digital rectal exam revealed no abnormalities of the rectum.   The LB [id number]  endoscope was introduced through the anus and advanced to the cecum, which was identified by both the appendix and ileocecal valve. No adverse events experienced.   The quality of the prep was excellent. (Suprep was used)  The instrument was then slowly withdrawn as the colon was fully examined. Estimated blood loss is zero unless otherwise noted in this procedure report.   COLON FINDINGS: Three polyps ranging between 3-80mm in size were found in the transverse colon and descending colon.  A polypectomy was performed with a cold snare.  The resection was complete, the polyp tissue was completely retrieved and sent to histology.   The examination was otherwise normal. Image 3 cecum. Image for ileocecal valve image 5 and 6 transverse colon polyps.   Retroflexed views revealed internal hemorrhoids. Image of rectal retroflexion taken but not captured. The time to cecum = 1.7 Withdrawal time = 12.8   The scope was withdrawn and the procedure completed. COMPLICATIONS: There were no immediate complications.  ENDOSCOPIC IMPRESSION: 1.   Three polyps were found in the transverse colon and descending colon; polypectomy was performed with a cold snare 2.   The examination was otherwise normal  RECOMMENDATIONS: 1. Repeat Colonoscopy in 3 years if all polyps adenomatous; otherwise 5 years.  eSigned:  Eustace Quail, MD 01/25/2015 9:14 AM   cc: The Patient and Lamar Blinks, MD

## 2015-01-25 NOTE — Progress Notes (Addendum)
Called to room to assist during endoscopic procedure.  Patient ID and intended procedure confirmed with present staff. Received instructions for my participation in the procedure from the performing physician.  

## 2015-01-26 ENCOUNTER — Telehealth: Payer: Self-pay | Admitting: *Deleted

## 2015-01-26 NOTE — Telephone Encounter (Signed)
  Follow up Call-  Call back number 01/25/2015  Post procedure Call Back phone  # [phone]  Permission to leave phone message Yes     Patient questions:  Do you have a fever, pain , or abdominal swelling? No. Pain Score  0 *  Have you tolerated food without any problems? Yes.    Have you been able to return to your normal activities? Yes.    Do you have any questions about your discharge instructions: Diet   No. Medications  No. Follow up visit  No.  Do you have questions or concerns about your Care? No.  Actions: * If pain score is 4 or above: No action needed, pain <4.

## 2015-02-01 ENCOUNTER — Encounter: Payer: Self-pay | Admitting: Internal Medicine

## 2015-02-22 ENCOUNTER — Telehealth: Payer: Self-pay

## 2015-02-22 DIAGNOSIS — I10 Essential (primary) hypertension: Secondary | ICD-10-CM

## 2015-02-22 MED ORDER — LOSARTAN POTASSIUM 50 MG PO TABS: 50.0000 mg | ORAL_TABLET | Freq: Every day | ORAL | Status: DC

## 2015-02-22 NOTE — Telephone Encounter (Signed)
Called her - lisinopril can certainly cause cough.  Will change to losartan 50.  She will let me know if her cough does not go away

## 2015-02-22 NOTE — Telephone Encounter (Signed)
Pt states that she has developed a deep cough and she believes it is from her blood pressure meds, and would like to know if dr copland could change the medication  Best number [phone]

## 2015-02-22 NOTE — Telephone Encounter (Signed)
On Lisinopril, common side effect.

## 2015-03-04 ENCOUNTER — Encounter: Payer: Self-pay | Admitting: Family Medicine

## 2015-03-09 ENCOUNTER — Encounter: Payer: Self-pay | Admitting: Family Medicine

## 2015-03-29 DIAGNOSIS — H5213 Myopia, bilateral: Secondary | ICD-10-CM | POA: Diagnosis not present

## 2015-03-29 DIAGNOSIS — H2513 Age-related nuclear cataract, bilateral: Secondary | ICD-10-CM | POA: Diagnosis not present

## 2015-03-29 DIAGNOSIS — H401231 Low-tension glaucoma, bilateral, mild stage: Secondary | ICD-10-CM | POA: Diagnosis not present

## 2015-07-02 ENCOUNTER — Other Ambulatory Visit: Payer: Self-pay | Admitting: Physician Assistant

## 2015-10-11 DIAGNOSIS — H5213 Myopia, bilateral: Secondary | ICD-10-CM | POA: Diagnosis not present

## 2015-10-11 DIAGNOSIS — H2513 Age-related nuclear cataract, bilateral: Secondary | ICD-10-CM | POA: Diagnosis not present

## 2015-10-11 DIAGNOSIS — H401221 Low-tension glaucoma, left eye, mild stage: Secondary | ICD-10-CM | POA: Diagnosis not present

## 2016-01-18 DIAGNOSIS — R69 Illness, unspecified: Secondary | ICD-10-CM | POA: Diagnosis not present

## 2016-02-11 ENCOUNTER — Other Ambulatory Visit: Payer: Self-pay

## 2016-02-11 DIAGNOSIS — I10 Essential (primary) hypertension: Secondary | ICD-10-CM

## 2016-02-11 MED ORDER — LOSARTAN POTASSIUM 50 MG PO TABS: 50.0000 mg | ORAL_TABLET | Freq: Every day | ORAL | 0 refills | Status: DC

## 2016-02-11 NOTE — Telephone Encounter (Signed)
Fax req [facility] for Losartan Potassuim 50mg  Pt due for CPE - called pt - scheduled CPE for 02/21/2016 @ 2:15pm Sent in 30 day refill  -0- refills

## 2016-02-14 ENCOUNTER — Ambulatory Visit: Payer: Medicare HMO

## 2016-02-21 ENCOUNTER — Ambulatory Visit (INDEPENDENT_AMBULATORY_CARE_PROVIDER_SITE_OTHER): Payer: Medicare HMO | Admitting: Family Medicine

## 2016-02-21 ENCOUNTER — Encounter: Payer: Self-pay | Admitting: Family Medicine

## 2016-02-21 VITALS — BP 126/2 | HR 70 | Temp 98.4°F | Resp 18 | Ht 72.0 in | Wt 241.0 lb

## 2016-02-21 DIAGNOSIS — Z23 Encounter for immunization: Secondary | ICD-10-CM

## 2016-02-21 DIAGNOSIS — Z1159 Encounter for screening for other viral diseases: Secondary | ICD-10-CM

## 2016-02-21 DIAGNOSIS — Z87898 Personal history of other specified conditions: Secondary | ICD-10-CM

## 2016-02-21 DIAGNOSIS — Z1231 Encounter for screening mammogram for malignant neoplasm of breast: Secondary | ICD-10-CM | POA: Diagnosis not present

## 2016-02-21 DIAGNOSIS — R7303 Prediabetes: Secondary | ICD-10-CM

## 2016-02-21 DIAGNOSIS — Z0000 Encounter for general adult medical examination without abnormal findings: Secondary | ICD-10-CM | POA: Diagnosis not present

## 2016-02-21 DIAGNOSIS — Z8742 Personal history of other diseases of the female genital tract: Secondary | ICD-10-CM

## 2016-02-21 DIAGNOSIS — E669 Obesity, unspecified: Secondary | ICD-10-CM | POA: Diagnosis not present

## 2016-02-21 DIAGNOSIS — I10 Essential (primary) hypertension: Secondary | ICD-10-CM | POA: Diagnosis not present

## 2016-02-21 DIAGNOSIS — E7800 Pure hypercholesterolemia, unspecified: Secondary | ICD-10-CM

## 2016-02-21 DIAGNOSIS — E2839 Other primary ovarian failure: Secondary | ICD-10-CM

## 2016-02-21 LAB — POCT URINALYSIS DIP (MANUAL ENTRY)
Bilirubin, UA: NEGATIVE
GLUCOSE UA: NEGATIVE
Ketones, POC UA: NEGATIVE
Leukocytes, UA: NEGATIVE
NITRITE UA: NEGATIVE
PROTEIN UA: NEGATIVE
SPEC GRAV UA: 1.02
UROBILINOGEN UA: 0.2
pH, UA: 7

## 2016-02-21 MED ORDER — OXYBUTYNIN CHLORIDE 5 MG PO TABS: ORAL_TABLET | ORAL | 3 refills | Status: DC

## 2016-02-21 MED ORDER — LOSARTAN POTASSIUM 50 MG PO TABS: 50.0000 mg | ORAL_TABLET | Freq: Every day | ORAL | 3 refills | Status: DC

## 2016-02-21 NOTE — Patient Instructions (Addendum)
[email or web address]   IF you received an x-ray today, you will receive an invoice from [facility]. Please contact [facility] at [phone] with questions or concerns regarding your invoice.   IF you received labwork today, you will receive an invoice from [facility]. Please contact [facility] at [phone] with questions or concerns regarding your invoice.   Our billing staff will not be able to assist you with questions regarding bills from these companies.  You will be contacted with the lab results as soon as they are available. The fastest way to get your results is to activate your My Chart account. Instructions are located on the last page of this paperwork. If you have not heard from [place] regarding the results in 2 weeks, please contact this office.    Keeping You Healthy  Get These Tests  Blood Pressure- Have your blood pressure checked by your healthcare provider at least once a year.  Normal blood pressure is 120/80.  Weight- Have your body mass index (BMI) calculated to screen for obesity.  BMI is a measure of body fat based on height and weight.  You can calculate your own BMI at [employer]it  Cholesterol- Have your cholesterol checked every year.  Diabetes- Have your blood sugar checked every year if you have high blood pressure, high cholesterol, a family history of diabetes or if you are overweight.  Pap Test - Have a pap test every 1 to 5 years if you have been sexually active.  If you are older than 65 and recent pap tests have been normal you may not need additional pap tests.  In addition, if you have had a hysterectomy  for benign disease additional pap tests are not necessary.  Mammogram-Yearly mammograms are essential for early detection of breast cancer  Screening for Colon Cancer- Colonoscopy starting at age 23. Screening may begin sooner depending on your family history and other health conditions.  Follow up colonoscopy as directed  by your Gastroenterologist.  Screening for Osteoporosis- Screening begins at age 61 with bone density scanning, sooner if you are at higher risk for developing Osteoporosis.  Get these medicines  Calcium with Vitamin D- Your body requires 1200-1500 mg of Calcium a day and [phone] IU of Vitamin D a day.  You can only absorb 500 mg of Calcium at a time therefore Calcium must be taken in 2 or 3 separate doses throughout the day.  Hormones- Hormone therapy has been associated with increased risk for certain cancers and heart disease.  Talk to your healthcare provider about if you need relief from menopausal symptoms.  Aspirin- Ask your healthcare provider about taking Aspirin to prevent Heart Disease and Stroke.  Get these Immuniztions  Flu shot- Every fall  Pneumonia shot- Once after the age of 60; if you are younger ask your healthcare provider if you need a pneumonia shot.  Tetanus- Every ten years.  Zostavax- Once after the age of 68 to prevent shingles.  Take these steps  Don't smoke- Your healthcare provider can help you quit. For tips on how to quit, ask your healthcare provider or go to www.smokefree.gov or call 1-800 QUIT-NOW.  Be physically active- Exercise 5 days a week for a minimum of 30 minutes.  If you are not already physically active, start slow and gradually work up to 30 minutes of moderate physical activity.  Try walking, dancing, bike riding, swimming, etc.  Eat a healthy diet- Eat a variety of healthy foods such as fruits, vegetables, whole grains, low fat  milk, low fat cheeses, yogurt, lean meats, chicken, fish, eggs, dried beans, tofu, etc.  For more information go to www.thenutritionsource.org  Dental visit- Brush and floss teeth twice daily; visit your dentist twice a year.  Eye exam- Visit your Optometrist or Ophthalmologist yearly.  Drink alcohol in moderation- Limit alcohol intake to one drink or less a day.  Never drink and drive.  Depression- Your  emotional health is as important as your physical health.  If you're feeling down or losing interest in things you normally enjoy, please talk to your healthcare provider.  Seat Belts- can save your life; always wear one  Smoke/Carbon Monoxide detectors- These detectors need to be installed on the appropriate level of your home.  Replace batteries at least once a year.  Violence- If anyone is threatening or hurting you, please tell your healthcare provider.  Living Will/ Health care power of attorney- Discuss with your healthcare provider and family.

## 2016-02-21 NOTE — Progress Notes (Signed)
Subjective:    Patient ID: Lisa Blair, female    DOB: 1951/01/04, 66 y.o.   MRN: [id number]  02/21/2016  Annual Exam   HPI This 66 y.o. female presents for Annual Wellness Examination and follow-up of chronic medical conditions.  Last physical: 11-17-2014 Pap smear: hysterectomy; 1998; one single ovary.  Fibroids.  DUB.   Mammogram: 12-31-2014 Colonoscopy: 01-25-2015 Bone density: several years ago; a while; 2006.   Eye exam: +glasses; [place]; 10/11/2015. Dental exam:  Twice year.   Immunization History  Administered Date(s) Administered  . Influenza Split 02/12/2010, 12/26/2011  . Influenza,inj,Quad PF,36+ Mos 11/17/2014  . Influenza-Unspecified 11/30/2013, 01/13/2016  . Pneumococcal Conjugate-13 11/17/2014  . Pneumococcal Polysaccharide-23 02/21/2016  . Td 02/12/2010  . Zoster 02/12/2010   BP Readings from Last 3 Encounters:  02/21/16 (!) 126/2  01/25/15 (!) 146/92  11/17/14 (!) 145/95   Wt Readings from Last 3 Encounters:  02/21/16 241 lb (109.3 kg)  01/25/15 240 lb (108.9 kg)  01/11/15 240 lb (108.9 kg)   HTN: Patient reports good compliance with medication, good tolerance to medication, and good symptom control.  Home BP running 140/85.    OAB: Patient reports good compliance with medication, good tolerance to medication, and good symptom control.    Review of Systems  Constitutional: Negative for activity change, appetite change, chills, diaphoresis, fatigue, fever and unexpected weight change.  HENT: Negative for congestion, dental problem, drooling, ear discharge, ear pain, facial swelling, hearing loss, mouth sores, nosebleeds, postnasal drip, rhinorrhea, sinus pressure, sneezing, sore throat, tinnitus, trouble swallowing and voice change.   Eyes: Negative for photophobia, pain, discharge, redness, itching and visual disturbance.  Respiratory: Negative for apnea, cough, choking, chest tightness, shortness of breath, wheezing and stridor.   Cardiovascular:  Negative for chest pain, palpitations and leg swelling.  Gastrointestinal: Negative for abdominal distention, abdominal pain, anal bleeding, blood in stool, constipation, diarrhea, nausea, rectal pain and vomiting.  Endocrine: Negative for cold intolerance, heat intolerance, polydipsia, polyphagia and polyuria.  Genitourinary: Negative for decreased urine volume, difficulty urinating, dyspareunia, dysuria, enuresis, flank pain, frequency, genital sores, hematuria, menstrual problem, pelvic pain, urgency, vaginal bleeding, vaginal discharge and vaginal pain.       Nocturia x 1.5 hours.  Musculoskeletal: Negative for arthralgias, back pain, gait problem, joint swelling, myalgias, neck pain and neck stiffness.  Skin: Negative for color change, pallor, rash and wound.  Allergic/Immunologic: Negative for environmental allergies, food allergies and immunocompromised state.  Neurological: Negative for dizziness, tremors, seizures, syncope, facial asymmetry, speech difficulty, weakness, light-headedness, numbness and headaches.  Hematological: Negative for adenopathy. Does not bruise/bleed easily.  Psychiatric/Behavioral: Negative for agitation, behavioral problems, confusion, decreased concentration, dysphoric mood, hallucinations, self-injury, sleep disturbance and suicidal ideas. The patient is not nervous/anxious and is not hyperactive.     Past Medical History:  Diagnosis Date  . Blood transfusion without reported diagnosis 1998   due to fibroids  . Glaucoma    in left eye  . Hypertension   . Loss of bladder control    Past Surgical History:  Procedure Laterality Date  . ABDOMINAL HYSTERECTOMY  1998   One ovary intact; DUB; fibroids  . DILATION AND CURETTAGE OF UTERUS     Allergies  Allergen Reactions  . Aleve [Naproxen Sodium]     LIGHTHEADNESS/can't function past 2 days  . Codeine Other (See Comments)    SEVERE HEADACHES  . Ibuprofen Other (See Comments)    UNABLE TO SLEEP    Current Outpatient Prescriptions  Medication Sig  Dispense Refill  . aspirin 81 MG tablet Take 81 mg by mouth daily.    . cholecalciferol (VITAMIN D) 1000 UNITS tablet Take 1,000 Units by mouth daily.    Marland Kitchen latanoprost (XALATAN) 0.005 % ophthalmic solution Place 1 drop into the left eye at bedtime.    Marland Kitchen losartan (COZAAR) 50 MG tablet Take 1 tablet (50 mg total) by mouth daily. 90 tablet 3  . Multiple Vitamin (MULTIVITAMIN) tablet Take 1 tablet by mouth daily.    Marland Kitchen oxybutynin (DITROPAN) 5 MG tablet TAKE 1/2 TABLET AT BEDTIME, OR 1/2 TAB TWICE A DAY FOR BLADDER. 90 tablet 3  . Phenazopyridine HCl (AZO TABS PO) Take by mouth at bedtime.     No current facility-administered medications for this visit.    Social History   Social History  . Marital status: Married    Spouse name: N/A  . Number of children: N/A  . Years of education: N/A   Occupational History  . Not on file.   Social History Main Topics  . Smoking status: Never Smoker  . Smokeless tobacco: Never Used  . Alcohol use 1.8 - 4.2 oz/week    3 - 7 Glasses of wine per week     Comment: 2 drinks  . Drug use: No  . Sexual activity: Not on file   Other Topics Concern  . Not on file   Social History Narrative   Marital status:  Married x 25 years; happily married ;second marriage.  Husband is 59 years old in 2018.      Children: one child (58); 2 grandchildren (84, 2); in [place].      Lives: with husband.      Employment: retired 25 years ago; retired from [employer]; IT trainer.      Tobacco: none; raised tobacco as a child.      Alcohol:  One glass per week    Education: college.       Exercise: Walking; sporadic.        ADLs: independent with ADLs; drives; no assistant devices.      Advanced Directives:  None; FULL CODE.  2018.   Family History  Problem Relation Age of Onset  . Cancer Father 72    pancreatic cancer  . Multiple sclerosis Son   . Cancer Sister     breast cancer with mets        Objective:    BP (!) 126/2 (BP Location: Right Arm, Patient Position: Sitting, Cuff Size: Large)   Pulse 70   Temp 98.4 F (36.9 C) (Oral)   Resp 18   Ht 6' (1.829 m)   Wt 241 lb (109.3 kg)   SpO2 96%   BMI 32.69 kg/m  Physical Exam  Constitutional: She is oriented to person, place, and time. She appears well-developed and well-nourished. No distress.  HENT:  Head: Normocephalic and atraumatic.  Right Ear: External ear normal.  Left Ear: External ear normal.  Nose: Nose normal.  Mouth/Throat: Oropharynx is clear and moist.  Eyes: Conjunctivae and EOM are normal. Pupils are equal, round, and reactive to light.  Neck: Normal range of motion and full passive range of motion without pain. Neck supple. No JVD present. Carotid bruit is not present. No thyromegaly present.  Cardiovascular: Normal rate, regular rhythm and normal heart sounds.  Exam reveals no gallop and no friction rub.   No murmur heard. Pulmonary/Chest: Effort normal and breath sounds normal. She has no wheezes. She has no rales. Right breast  exhibits no inverted nipple, no mass, no nipple discharge, no skin change and no tenderness. Left breast exhibits no inverted nipple, no mass, no nipple discharge, no skin change and no tenderness. Breasts are symmetrical.  Abdominal: Soft. Bowel sounds are normal. She exhibits no distension and no mass. There is no tenderness. There is no rebound and no guarding.  Musculoskeletal:       Right shoulder: Normal.       Left shoulder: Normal.       Cervical back: Normal.  Lymphadenopathy:    She has no cervical adenopathy.  Neurological: She is alert and oriented to person, place, and time. She has normal reflexes. No cranial nerve deficit. She exhibits normal muscle tone. Coordination normal.  Skin: Skin is warm and dry. No rash noted. She is not diaphoretic. No erythema. No pallor.  Psychiatric: She has a normal mood and affect. Her behavior is normal. Judgment and thought content  normal.  Nursing note and vitals reviewed.  Results for orders placed or performed in visit on 02/21/16  CBC with Differential/Platelet  Result Value Ref Range   WBC 6.3 3.4 - 10.8 x10E3/uL   RBC 4.68 3.77 - 5.28 x10E6/uL   Hemoglobin 14.1 11.1 - 15.9 g/dL   Hematocrit 42.2 34.0 - 46.6 %   MCV 90 79 - 97 fL   MCH 30.1 26.6 - 33.0 pg   MCHC 33.4 31.5 - 35.7 g/dL   RDW 13.7 12.3 - 15.4 %   Platelets 192 150 - 379 x10E3/uL   Neutrophils 67 Not Estab. %   Lymphs 25 Not Estab. %   Monocytes 6 Not Estab. %   Eos 2 Not Estab. %   Basos 0 Not Estab. %   Neutrophils Absolute 4.2 1.4 - 7.0 x10E3/uL   Lymphocytes Absolute 1.6 0.7 - 3.1 x10E3/uL   Monocytes Absolute 0.4 0.1 - 0.9 x10E3/uL   EOS (ABSOLUTE) 0.1 0.0 - 0.4 x10E3/uL   Basophils Absolute 0.0 0.0 - 0.2 x10E3/uL   Immature Granulocytes 0 Not Estab. %   Immature Grans (Abs) 0.0 0.0 - 0.1 x10E3/uL  Comprehensive metabolic panel  Result Value Ref Range   Glucose 91 65 - 99 mg/dL   BUN 14 8 - 27 mg/dL   Creatinine, Ser 0.59 0.57 - 1.00 mg/dL   GFR calc non Af Amer 96 >59 mL/min/1.73   GFR calc Af Amer 110 >59 mL/min/1.73   BUN/Creatinine Ratio 24 12 - 28   Sodium 145 (H) 134 - 144 mmol/L   Potassium 4.7 3.5 - 5.2 mmol/L   Chloride 107 (H) 96 - 106 mmol/L   CO2 21 18 - 29 mmol/L   Calcium 9.1 8.7 - 10.3 mg/dL   Total Protein 6.8 6.0 - 8.5 g/dL   Albumin 3.9 3.6 - 4.8 g/dL   Globulin, Total 2.9 1.5 - 4.5 g/dL   Albumin/Globulin Ratio 1.3 1.2 - 2.2   Bilirubin Total 0.4 0.0 - 1.2 mg/dL   Alkaline Phosphatase 69 39 - 117 IU/L   AST 17 0 - 40 IU/L   ALT 17 0 - 32 IU/L  Lipid panel  Result Value Ref Range   Cholesterol, Total 187 100 - 199 mg/dL   Triglycerides 137 0 - 149 mg/dL   HDL 45 >39 mg/dL   VLDL Cholesterol Cal 27 5 - 40 mg/dL   LDL Calculated 115 (H) 0 - 99 mg/dL   Chol/HDL Ratio 4.2 0.0 - 4.4 ratio units  Hemoglobin A1c  Result Value Ref Range  Hgb A1c MFr Bld 5.6 4.8 - 5.6 %   Est. average glucose Bld gHb  Est-mCnc 114 mg/dL  Hepatitis C antibody  Result Value Ref Range   Hep C Virus Ab <0.1 0.0 - 0.9 s/co ratio  POCT urinalysis dipstick  Result Value Ref Range   Color, UA yellow yellow   Clarity, UA clear clear   Glucose, UA negative negative   Bilirubin, UA negative negative   Ketones, POC UA negative negative   Spec Grav, UA 1.020    Blood, UA small (A) negative   pH, UA 7.0    Protein Ur, POC negative negative   Urobilinogen, UA 0.2    Nitrite, UA Negative Negative   Leukocytes, UA Negative Negative       Depression screen [facility] 2/9 02/21/2016 11/17/2014 07/08/2014  Decreased Interest 0 0 0  Down, Depressed, Hopeless 0 0 0  PHQ - 2 Score 0 0 0   Fall Risk  02/21/2016 11/17/2014 07/08/2014  Falls in the past year? No No No    Assessment & Plan:   1. Encounter for Medicare annual wellness exam   2. Essential hypertension   3. History of abnormal cervical Pap smear   4. History of abnormal mammogram   5. Obesity (BMI 30.0-34.9)   6. Pre-diabetes   7. Encounter for hepatitis C screening test for low risk patient   8. Pure hypercholesterolemia   9. Encounter for screening mammogram for breast cancer   10. Estrogen deficiency   11. Need for prophylactic vaccination against Streptococcus pneumoniae (pneumococcus)     Orders Placed This Encounter  Procedures  . MM SCREENING BREAST TOMO BILATERAL    Standing Status:   Future    Standing Expiration Date:   04/23/2017    Scheduling Instructions:     [facility]    Order Specific Question:   Reason for Exam (SYMPTOM  OR DIAGNOSIS REQUIRED)    Answer:   annual screening    Order Specific Question:   Preferred imaging location?    Answer:   External  . DG Bone Density    Standing Status:   Future    Standing Expiration Date:   04/20/2017    Scheduling Instructions:     [facility]    Order Specific Question:   Reason for Exam (SYMPTOM  OR DIAGNOSIS REQUIRED)    Answer:   estrogen deficiency    Order Specific Question:   Preferred imaging  location?    Answer:   External  . Pneumococcal polysaccharide vaccine 23-valent greater than or equal to 2yo subcutaneous/IM  . CBC with Differential/Platelet  . Comprehensive metabolic panel    Order Specific Question:   Has the patient fasted?    Answer:   Yes  . Lipid panel    Order Specific Question:   Has the patient fasted?    Answer:   Yes  . Hemoglobin A1c  . Hepatitis C antibody  . Care order/instruction:    AVS printed - let patient go!  Marland Kitchen POCT urinalysis dipstick  . EKG 12-Lead   Meds ordered this encounter  Medications  . losartan (COZAAR) 50 MG tablet    Sig: Take 1 tablet (50 mg total) by mouth daily.    Dispense:  90 tablet    Refill:  3  . oxybutynin (DITROPAN) 5 MG tablet    Sig: TAKE 1/2 TABLET AT BEDTIME, OR 1/2 TAB TWICE A DAY FOR BLADDER.    Dispense:  90 tablet    Refill:  3    Office    Return in about 6 months (around 08/20/2016) for recheck high blood pressure.   Lisa Blair Elayne Guerin, M.D. [facility] [address] [place], [place]  [address] [phone] phone [phone] fax

## 2016-02-22 LAB — COMPREHENSIVE METABOLIC PANEL
A/G RATIO: 1.3 (ref 1.2–2.2)
ALBUMIN: 3.9 g/dL (ref 3.6–4.8)
ALT: 17 IU/L (ref 0–32)
AST: 17 IU/L (ref 0–40)
Alkaline Phosphatase: 69 IU/L (ref 39–117)
BILIRUBIN TOTAL: 0.4 mg/dL (ref 0.0–1.2)
BUN / CREAT RATIO: 24 (ref 12–28)
BUN: 14 mg/dL (ref 8–27)
CALCIUM: 9.1 mg/dL (ref 8.7–10.3)
CHLORIDE: 107 mmol/L — AB (ref 96–106)
CO2: 21 mmol/L (ref 18–29)
Creatinine, Ser: 0.59 mg/dL (ref 0.57–1.00)
GFR, EST AFRICAN AMERICAN: 110 mL/min/{1.73_m2} (ref >59)
GFR, EST NON AFRICAN AMERICAN: 96 mL/min/{1.73_m2} (ref >59)
GLUCOSE: 91 mg/dL (ref 65–99)
Globulin, Total: 2.9 g/dL (ref 1.5–4.5)
Potassium: 4.7 mmol/L (ref 3.5–5.2)
Sodium: 145 mmol/L — ABNORMAL HIGH (ref 134–144)
TOTAL PROTEIN: 6.8 g/dL (ref 6.0–8.5)

## 2016-02-22 LAB — CBC WITH DIFFERENTIAL/PLATELET
BASOS: 0 %
Basophils Absolute: 0 10*3/uL (ref 0.0–0.2)
EOS (ABSOLUTE): 0.1 10*3/uL (ref 0.0–0.4)
Eos: 2 %
HEMOGLOBIN: 14.1 g/dL (ref 11.1–15.9)
Hematocrit: 42.2 % (ref 34.0–46.6)
IMMATURE GRANS (ABS): 0 10*3/uL (ref 0.0–0.1)
IMMATURE GRANULOCYTES: 0 %
LYMPHS: 25 %
Lymphocytes Absolute: 1.6 10*3/uL (ref 0.7–3.1)
MCH: 30.1 pg (ref 26.6–33.0)
MCHC: 33.4 g/dL (ref 31.5–35.7)
MCV: 90 fL (ref 79–97)
MONOCYTES: 6 %
Monocytes Absolute: 0.4 10*3/uL (ref 0.1–0.9)
NEUTROS ABS: 4.2 10*3/uL (ref 1.4–7.0)
NEUTROS PCT: 67 %
Platelets: 192 10*3/uL (ref 150–379)
RBC: 4.68 x10E6/uL (ref 3.77–5.28)
RDW: 13.7 % (ref 12.3–15.4)
WBC: 6.3 10*3/uL (ref 3.4–10.8)

## 2016-02-22 LAB — LIPID PANEL
CHOL/HDL RATIO: 4.2 ratio (ref 0.0–4.4)
Cholesterol, Total: 187 mg/dL (ref 100–199)
HDL: 45 mg/dL (ref >39)
LDL Calculated: 115 mg/dL — ABNORMAL HIGH (ref 0–99)
Triglycerides: 137 mg/dL (ref 0–149)
VLDL CHOLESTEROL CAL: 27 mg/dL (ref 5–40)

## 2016-02-22 LAB — HEMOGLOBIN A1C
ESTIMATED AVERAGE GLUCOSE: 114 mg/dL
HEMOGLOBIN A1C: 5.6 % (ref 4.8–5.6)

## 2016-02-22 LAB — HEPATITIS C ANTIBODY: Hep C Virus Ab: <0.1 s/co ratio (ref 0.0–0.9)

## 2016-03-05 ENCOUNTER — Encounter: Payer: Self-pay | Admitting: Family Medicine

## 2016-03-05 DIAGNOSIS — Z780 Asymptomatic menopausal state: Secondary | ICD-10-CM | POA: Diagnosis not present

## 2016-03-05 DIAGNOSIS — M85851 Other specified disorders of bone density and structure, right thigh: Secondary | ICD-10-CM | POA: Diagnosis not present

## 2016-03-05 DIAGNOSIS — Z1231 Encounter for screening mammogram for malignant neoplasm of breast: Secondary | ICD-10-CM | POA: Diagnosis not present

## 2016-03-05 DIAGNOSIS — Z803 Family history of malignant neoplasm of breast: Secondary | ICD-10-CM | POA: Diagnosis not present

## 2016-04-18 ENCOUNTER — Telehealth: Payer: Self-pay | Admitting: Radiology

## 2016-04-18 NOTE — Telephone Encounter (Signed)
Called the patient , informed of results and advised to repeat bone density in 2 years. At the patient's request, I made a copy of report, and I mailed to her.

## 2016-04-30 DIAGNOSIS — H4010X1 Unspecified open-angle glaucoma, mild stage: Secondary | ICD-10-CM | POA: Diagnosis not present

## 2016-04-30 DIAGNOSIS — H2513 Age-related nuclear cataract, bilateral: Secondary | ICD-10-CM | POA: Diagnosis not present

## 2016-04-30 DIAGNOSIS — H401221 Low-tension glaucoma, left eye, mild stage: Secondary | ICD-10-CM | POA: Diagnosis not present

## 2016-04-30 DIAGNOSIS — H5213 Myopia, bilateral: Secondary | ICD-10-CM | POA: Diagnosis not present

## 2016-06-06 DIAGNOSIS — R350 Frequency of micturition: Secondary | ICD-10-CM | POA: Diagnosis not present

## 2016-06-06 DIAGNOSIS — Z6832 Body mass index (BMI) 32.0-32.9, adult: Secondary | ICD-10-CM | POA: Diagnosis not present

## 2016-06-06 DIAGNOSIS — Z79899 Other long term (current) drug therapy: Secondary | ICD-10-CM | POA: Diagnosis not present

## 2016-06-06 DIAGNOSIS — I10 Essential (primary) hypertension: Secondary | ICD-10-CM | POA: Diagnosis not present

## 2016-06-06 DIAGNOSIS — Z0000 Encounter for general adult medical examination without abnormal findings: Secondary | ICD-10-CM | POA: Diagnosis not present

## 2016-06-06 DIAGNOSIS — E669 Obesity, unspecified: Secondary | ICD-10-CM | POA: Diagnosis not present

## 2016-06-06 DIAGNOSIS — N329 Bladder disorder, unspecified: Secondary | ICD-10-CM | POA: Diagnosis not present

## 2016-08-21 ENCOUNTER — Ambulatory Visit: Payer: Medicare HMO | Admitting: Family Medicine

## 2016-09-04 ENCOUNTER — Encounter: Payer: Self-pay | Admitting: Family Medicine

## 2016-09-04 ENCOUNTER — Ambulatory Visit (INDEPENDENT_AMBULATORY_CARE_PROVIDER_SITE_OTHER): Payer: Medicare HMO | Admitting: Family Medicine

## 2016-09-04 VITALS — BP 122/78 | HR 75 | Temp 98.5°F | Resp 18 | Ht 70.87 in | Wt 240.0 lb

## 2016-09-04 DIAGNOSIS — R3915 Urgency of urination: Secondary | ICD-10-CM

## 2016-09-04 DIAGNOSIS — R3129 Other microscopic hematuria: Secondary | ICD-10-CM

## 2016-09-04 DIAGNOSIS — R05 Cough: Secondary | ICD-10-CM

## 2016-09-04 DIAGNOSIS — I10 Essential (primary) hypertension: Secondary | ICD-10-CM | POA: Diagnosis not present

## 2016-09-04 DIAGNOSIS — R7303 Prediabetes: Secondary | ICD-10-CM

## 2016-09-04 DIAGNOSIS — Z780 Asymptomatic menopausal state: Secondary | ICD-10-CM

## 2016-09-04 DIAGNOSIS — R059 Cough, unspecified: Secondary | ICD-10-CM

## 2016-09-04 DIAGNOSIS — Z862 Personal history of diseases of the blood and blood-forming organs and certain disorders involving the immune mechanism: Secondary | ICD-10-CM

## 2016-09-04 LAB — POCT URINALYSIS DIP (MANUAL ENTRY)
GLUCOSE UA: NEGATIVE mg/dL
LEUKOCYTES UA: NEGATIVE
NITRITE UA: NEGATIVE
PROTEIN UA: NEGATIVE mg/dL
UROBILINOGEN UA: 0.2 U/dL
pH, UA: 5.5 (ref 5.0–8.0)

## 2016-09-04 MED ORDER — LOSARTAN POTASSIUM 50 MG PO TABS: 50.0000 mg | ORAL_TABLET | Freq: Every day | ORAL | 3 refills | Status: DC

## 2016-09-04 NOTE — Patient Instructions (Addendum)
   ZANTAC AT BEDTIME FOR REFLUX/COUGH.  IF you received an x-ray today, you will receive an invoice from [facility]. Please contact [facility] at [phone] with questions or concerns regarding your invoice.   IF you received labwork today, you will receive an invoice from [facility]. Please contact [facility] at [phone] with questions or concerns regarding your invoice.   Our billing staff will not be able to assist you with questions regarding bills from these companies.  You will be contacted with the lab results as soon as they are available. The fastest way to get your results is to activate your My Chart account. Instructions are located on the last page of this paperwork. If you have not heard from [place] regarding the results in 2 weeks, please contact this office.

## 2016-09-04 NOTE — Progress Notes (Signed)
Subjective:    Patient ID: Lisa Blair, female    DOB: [birth year], 66 y.o.   MRN: [id number]  09/04/2016  Hypertension (6 month follow-up)   HPI This 66 y.o. female presents for evaluation of hypertension, glucose intolerance, OAB.  Patient reports good compliance with medication, good tolerance to medication, and good symptom control.     Fall L calf: fell; knot anterior medial leg.  Hit medial calf with fall.  Three steps that go down to balcony.  Stuff on steps.  Caught self on balcony.  Never really hurt hurt.  Weightbearing was minimally. Pain only with touchign. Three weeks ago.  Minimal exercise; doing 5000 steps.  Was walking with husband; then went to beach.  Cough: for years. Since vegetable garden, developed mold in corn.  Both coughed.Duration three months with garden. If lays on back, will cough.  Dust    BP Readings from Last 3 Encounters:  09/04/16 122/78  02/21/16 (!) 126/2  01/25/15 (!) 146/92   Wt Readings from Last 3 Encounters:  09/04/16 240 lb (108.9 kg)  02/21/16 241 lb (109.3 kg)  01/25/15 240 lb (108.9 kg)   Immunization History  Administered Date(s) Administered  . Influenza Split 02/12/2010, 12/26/2011  . Influenza,inj,Quad PF,36+ Mos 11/17/2014  . Influenza-Unspecified 11/30/2013, 01/13/2016  . Pneumococcal Conjugate-13 11/17/2014  . Pneumococcal Polysaccharide-23 02/21/2016  . Td 02/12/2010  . Zoster 02/12/2010     Review of Systems  Constitutional: Negative for chills, diaphoresis, fatigue and fever.  Eyes: Negative for visual disturbance.  Respiratory: Positive for cough. Negative for shortness of breath.   Cardiovascular: Negative for chest pain, palpitations and leg swelling.  Gastrointestinal: Negative for abdominal pain, constipation, diarrhea, nausea and vomiting.  Endocrine: Negative for cold intolerance, heat intolerance, polydipsia, polyphagia and polyuria.  Musculoskeletal: Positive for myalgias.  Neurological: Negative for  dizziness, tremors, seizures, syncope, facial asymmetry, speech difficulty, weakness, light-headedness, numbness and headaches.    Past Medical History:  Diagnosis Date  . Blood transfusion without reported diagnosis 1998   due to fibroids  . Glaucoma    in left eye  . Hypertension   . Loss of bladder control    Past Surgical History:  Procedure Laterality Date  . ABDOMINAL HYSTERECTOMY  1998   One ovary intact; DUB; fibroids  . DILATION AND CURETTAGE OF UTERUS     Allergies  Allergen Reactions  . Aleve [Naproxen Sodium]     LIGHTHEADNESS/can't function past 2 days  . Codeine Other (See Comments)    SEVERE HEADACHES  . Ibuprofen Other (See Comments)    UNABLE TO SLEEP    Social History   Social History  . Marital status: Married    Spouse name: N/A  . Number of children: N/A  . Years of education: N/A   Occupational History  . Not on file.   Social History Main Topics  . Smoking status: Never Smoker  . Smokeless tobacco: Never Used  . Alcohol use 1.8 - 4.2 oz/week    3 - 7 Glasses of wine per week     Comment: 2 drinks  . Drug use: No  . Sexual activity: Not on file   Other Topics Concern  . Not on file   Social History Narrative   Marital status:  Married x 25 years; happily married ;second marriage.  Husband is 62 years old in 2018.      Children: one child (31); 2 grandchildren (48, 2); in [place].      Lives: with  husband.      Employment: retired 25 years ago; retired from [employer]; IT trainer.      Tobacco: none; raised tobacco as a child.      Alcohol:  One glass per week    Education: college.       Exercise: Walking; sporadic.        ADLs: independent with ADLs; drives; no assistant devices.      Advanced Directives:  None; FULL CODE.  2018.   Family History  Problem Relation Age of Onset  . Cancer Father 10       pancreatic cancer  . Multiple sclerosis Son   . Cancer Sister        breast cancer with mets       Objective:      BP 122/78   Pulse 75   Temp 98.5 F (36.9 C) (Oral)   Resp 18   Ht 5' 10.87" (1.8 m)   Wt 240 lb (108.9 kg)   SpO2 96%   BMI 33.60 kg/m  Physical Exam  Constitutional: She is oriented to person, place, and time. She appears well-developed and well-nourished. No distress.  HENT:  Head: Normocephalic and atraumatic.  Right Ear: External ear normal.  Left Ear: External ear normal.  Nose: Nose normal.  Mouth/Throat: Oropharynx is clear and moist.  Eyes: Pupils are equal, round, and reactive to light. Conjunctivae and EOM are normal.  Neck: Normal range of motion. Neck supple. Carotid bruit is not present. No thyromegaly present.  Cardiovascular: Normal rate, regular rhythm, normal heart sounds and intact distal pulses.  Exam reveals no gallop and no friction rub.   No murmur heard. Pulmonary/Chest: Effort normal and breath sounds normal. She has no wheezes. She has no rales.  Abdominal: Soft. Bowel sounds are normal. She exhibits no distension and no mass. There is no tenderness. There is no rebound and no guarding.  Musculoskeletal:       Right lower leg: Normal. She exhibits no tenderness, no bony tenderness, no swelling and no edema.       Left lower leg: Normal. She exhibits no tenderness, no bony tenderness, no swelling and no edema.  Lymphadenopathy:    She has no cervical adenopathy.  Neurological: She is alert and oriented to person, place, and time. No cranial nerve deficit.  Skin: Skin is warm and dry. No rash noted. She is not diaphoretic. No erythema. No pallor.  Psychiatric: She has a normal mood and affect. Her behavior is normal.   Results for orders placed or performed in visit on 09/04/16  POCT urinalysis dipstick  Result Value Ref Range   Color, UA yellow yellow   Clarity, UA clear clear   Glucose, UA negative negative mg/dL   Bilirubin, UA small (A) negative   Ketones, POC UA trace (5) (A) negative mg/dL   Spec Grav, UA >=1.030 (A) 1.010 - 1.025   Blood, UA  trace-lysed (A) negative   pH, UA 5.5 5.0 - 8.0   Protein Ur, POC negative negative mg/dL   Urobilinogen, UA 0.2 0.2 or 1.0 E.U./dL   Nitrite, UA Negative Negative   Leukocytes, UA Negative Negative   Depression screen [facility] 2/9 09/04/2016 02/21/2016 11/17/2014 07/08/2014  Decreased Interest 0 0 0 0  Down, Depressed, Hopeless 0 0 0 0  PHQ - 2 Score 0 0 0 0   Fall Risk  09/04/2016 02/21/2016 11/17/2014 07/08/2014  Falls in the past year? No No No No  Assessment & Plan:   1. Essential hypertension   2. Pre-diabetes   3. Urinary urgency   4. Post-menopausal   5. History of anemia   6. Microscopic hematuria   7. Cough    -new onset nighttime cough; ddx includes GERD versus allergic rhinitis; recommend Zantac qhs for GERD induced cough. -obtain labs; blood pressure well controlled; refill provided. -obtain u/a today - I recommend weight loss, exercise, and low-carbohydrate low-sugar food choices. You should AVOID: regular sodas, sweetened tea, fruit juices.  You should LIMIT: breads, pastas, rice, potatoes, and desserts/sweets.  I would recommend limiting your total carbohydrate intake per meal to 45 grams; I would limit your total carbohydrate intake per snack to 30 grams.  I would also have a goal of 60 grams of protein intake per day; this would equal 10-15 grams of protein per meal and 5-10 grams of protein per snack. -recommend weight loss, exercise for 30-60 minutes five days per week; recommend 1200 kcal restriction per day with a minimum of 60 grams of protein per day.    Orders Placed This Encounter  Procedures  . Comprehensive metabolic panel    Order Specific Question:   Has the patient fasted?    Answer:   Yes  . Hemoglobin A1c  . Urine Microscopic  . POCT urinalysis dipstick   Meds ordered this encounter  Medications  . AZO-CRANBERRY PO    Sig: Take by mouth.  . losartan (COZAAR) 50 MG tablet    Sig: Take 1 tablet (50 mg total) by mouth daily.    Dispense:  90  tablet    Refill:  3    Return in about 6 months (around 03/07/2017) for complete physical examiniation.   Talik Casique Elayne Guerin, M.D. [facility] [address] [place], [place]  [address] [phone] phone [phone] fax

## 2016-09-05 LAB — URINALYSIS, MICROSCOPIC ONLY: Casts: NONE SEEN /lpf

## 2016-09-05 LAB — COMPREHENSIVE METABOLIC PANEL
ALBUMIN: 4.2 g/dL (ref 3.6–4.8)
ALK PHOS: 68 IU/L (ref 39–117)
ALT: 19 IU/L (ref 0–32)
AST: 18 IU/L (ref 0–40)
Albumin/Globulin Ratio: 1.7 (ref 1.2–2.2)
BILIRUBIN TOTAL: 0.3 mg/dL (ref 0.0–1.2)
BUN/Creatinine Ratio: 27 (ref 12–28)
BUN: 17 mg/dL (ref 8–27)
CHLORIDE: 106 mmol/L (ref 96–106)
CO2: 23 mmol/L (ref 20–29)
Calcium: 9.7 mg/dL (ref 8.7–10.3)
Creatinine, Ser: 0.64 mg/dL (ref 0.57–1.00)
GFR calc Af Amer: 107 mL/min/{1.73_m2} (ref >59)
GFR calc non Af Amer: 93 mL/min/{1.73_m2} (ref >59)
GLOBULIN, TOTAL: 2.5 g/dL (ref 1.5–4.5)
Glucose: 96 mg/dL (ref 65–99)
POTASSIUM: 4.9 mmol/L (ref 3.5–5.2)
SODIUM: 143 mmol/L (ref 134–144)
Total Protein: 6.7 g/dL (ref 6.0–8.5)

## 2016-09-05 LAB — HEMOGLOBIN A1C
Est. average glucose Bld gHb Est-mCnc: 114 mg/dL
HEMOGLOBIN A1C: 5.6 % (ref 4.8–5.6)

## 2016-10-30 DIAGNOSIS — H401221 Low-tension glaucoma, left eye, mild stage: Secondary | ICD-10-CM | POA: Diagnosis not present

## 2016-10-30 DIAGNOSIS — H5213 Myopia, bilateral: Secondary | ICD-10-CM | POA: Diagnosis not present

## 2016-10-30 DIAGNOSIS — H2513 Age-related nuclear cataract, bilateral: Secondary | ICD-10-CM | POA: Diagnosis not present

## 2016-11-13 DIAGNOSIS — R69 Illness, unspecified: Secondary | ICD-10-CM | POA: Diagnosis not present

## 2017-03-13 ENCOUNTER — Ambulatory Visit: Payer: Medicare HMO

## 2017-03-15 ENCOUNTER — Ambulatory Visit (INDEPENDENT_AMBULATORY_CARE_PROVIDER_SITE_OTHER): Payer: Medicare HMO

## 2017-03-15 VITALS — BP 140/84 | HR 89 | Temp 98.1°F | Ht 71.0 in | Wt 241.0 lb

## 2017-03-15 DIAGNOSIS — Z0000 Encounter for general adult medical examination without abnormal findings: Secondary | ICD-10-CM

## 2017-03-15 NOTE — Progress Notes (Signed)
Subjective:   Lisa Blair is a 67 y.o. female who presents for Medicare Annual (Subsequent) preventive examination.  Review of Systems:  N/A Cardiac Risk Factors include: advanced age (>32men, >72 women);hypertension;obesity (BMI >30kg/m2)     Objective:     Vitals: BP 140/84   Pulse 89   Temp 98.1 F (36.7 C) (Oral)   Ht 5\' 11"  (1.803 m)   Wt 241 lb (109.3 kg)   SpO2 95%   BMI 33.61 kg/m   Body mass index is 33.61 kg/m.  Advanced Directives 03/15/2017 02/21/2016  Does Patient Have a Medical Advance Directive? No No  Would patient like information on creating a medical advance directive? No - Patient declined Yes (Inpatient - patient requests chaplain consult to create a medical advance directive)    Tobacco Social History   Tobacco Use  Smoking Status Never Smoker  Smokeless Tobacco Never Used     Counseling given: Not Answered   Clinical Intake:  Pre-visit preparation completed: Yes  Pain : No/denies pain     Nutritional Status: BMI > 30  Obese Nutritional Risks: None Diabetes: No  How often do you need to have someone help you when you read instructions, pamphlets, or other written materials from your doctor or pharmacy?: 1 - Never What is the last grade level you completed in school?: Bachelors Degree  Interpreter Needed?: No  Information entered by :: Andrez Grime, LPN  Past Medical History:  Diagnosis Date  . Blood transfusion without reported diagnosis 1998   due to fibroids  . Glaucoma    in left eye  . Hypertension   . Loss of bladder control    Past Surgical History:  Procedure Laterality Date  . ABDOMINAL HYSTERECTOMY  1998   One ovary intact; DUB; fibroids  . DILATION AND CURETTAGE OF UTERUS     Family History  Problem Relation Age of Onset  . Cancer Father 21       pancreatic cancer  . Multiple sclerosis Son   . Cancer Sister        breast cancer with mets   Social History   Socioeconomic History  . Marital status:  Married    Spouse name: None  . Number of children: 1  . Years of education: None  . Highest education level: Bachelor's degree (e.g., BA, AB, BS)  Social Needs  . Financial resource strain: Not hard at all  . Food insecurity - worry: Never true  . Food insecurity - inability: Never true  . Transportation needs - medical: No  . Transportation needs - non-medical: No  Occupational History  . None  Tobacco Use  . Smoking status: Never Smoker  . Smokeless tobacco: Never Used  Substance and Sexual Activity  . Alcohol use: Yes    Comment: 4 glasses of wine a month   . Drug use: No  . Sexual activity: None  Other Topics Concern  . None  Social History Narrative   Marital status:  Married x 25 years; happily married ;second marriage.  Husband is 47 years old in 2018.      Children: one child (50); 2 grandchildren (20, 2); in [place].      Lives: with husband.      Employment: retired 25 years ago; retired from [employer]; IT trainer.      Tobacco: none; raised tobacco as a child.      Alcohol:  One glass per week    Education: college.  Exercise: Walking; sporadic.        ADLs: independent with ADLs; drives; no assistant devices.      Advanced Directives:  None; FULL CODE.  2018.    Outpatient Encounter Medications as of 03/15/2017  Medication Sig  . Ascorbic Acid (VITAMIN C) 1000 MG tablet Take 1,000 mg by mouth daily.  Marland Kitchen aspirin 81 MG tablet Take 81 mg by mouth daily.  . cholecalciferol (VITAMIN D) 1000 UNITS tablet Take 1,000 Units by mouth daily.  . cyanocobalamin 1000 MCG tablet Take 1,000 mcg by mouth daily.  Marland Kitchen latanoprost (XALATAN) 0.005 % ophthalmic solution Place 1 drop into the left eye at bedtime.  Marland Kitchen losartan (COZAAR) 50 MG tablet Take 1 tablet (50 mg total) by mouth daily.  Marland Kitchen oxybutynin (DITROPAN) 5 MG tablet TAKE 1/2 TABLET AT BEDTIME, OR 1/2 TAB TWICE A DAY FOR BLADDER.  Marland Kitchen Pyridoxine HCl (VITAMIN B-6 PO) Take 10 mg by mouth daily.  . ranitidine (ZANTAC)  75 MG tablet Take by mouth.  . [DISCONTINUED] AZO-CRANBERRY PO Take by mouth.  . [DISCONTINUED] Multiple Vitamin (MULTIVITAMIN) tablet Take 1 tablet by mouth daily.  . [DISCONTINUED] oxybutynin (DITROPAN) 5 MG tablet TAKE 1/2 TABLET BY MOUTH AT BEDTIME OR 1/2 TABLET TWICE A DAY FOR BLADDER   No facility-administered encounter medications on file as of 03/15/2017.     Activities of Daily Living In your present state of health, do you have any difficulty performing the following activities: 03/15/2017  Hearing? N  Vision? N  Difficulty concentrating or making decisions? N  Walking or climbing stairs? N  Dressing or bathing? N  Doing errands, shopping? N  Preparing Food and eating ? N  Using the Toilet? N  In the past six months, have you accidently leaked urine? Y  Comment Patient wears pads daily   Do you have problems with loss of bowel control? N  Managing your Medications? N  Managing your Finances? N  Housekeeping or managing your Housekeeping? N  Some recent data might be hidden    Patient Care Team: Wardell Honour, MD as PCP - General (Family Medicine) Burden, Lincoln Brigham, MD as Referring Physician (Ophthalmology)    Assessment:   This is a routine wellness examination for [place].  Exercise Activities and Dietary recommendations Current Exercise Habits: The patient does not participate in regular exercise at present, Exercise limited by: None identified  Goals    . Exercise 3x per week (30 min per time)     Patient states that she wants to try to start exercising more on a consistent basis.        Fall Risk Fall Risk  03/15/2017 09/04/2016 02/21/2016 11/17/2014 07/08/2014  Falls in the past year? No No No No No   Is the patient's home free of loose throw rugs in walkways, pet beds, electrical cords, etc?   yes      Grab bars in the bathroom? no      Handrails on the stairs?   yes      Adequate lighting?   yes  Timed Get Up and Go performed:yes,  completed within 30 seconds     Depression Screen PHQ 2/9 Scores 03/15/2017 09/04/2016 02/21/2016 11/17/2014  PHQ - 2 Score 0 0 0 0     Cognitive Function     6CIT Screen 03/15/2017  What Year? 0 points  What month? 0 points  What time? 0 points  Count back from 20 0 points  Months in reverse 0 points  Repeat phrase 0 points  Total Score 0    Immunization History  Administered Date(s) Administered  . Influenza Split 02/12/2010, 12/26/2011  . Influenza,inj,Quad PF,6+ Mos 11/17/2014  . Influenza-Unspecified 11/30/2013, 01/13/2016, 11/13/2016  . Pneumococcal Conjugate-13 11/17/2014  . Pneumococcal Polysaccharide-23 02/21/2016, 11/13/2016  . Td 02/12/2010  . Zoster 02/12/2010    Qualifies for Shingles Vaccine? Advised patient to check with her pharmacy about receiving the Shingrix vaccine  Screening Tests Health Maintenance  Topic Date Due  . DEXA SCAN  03/15/2018 (Originally 06/28/2014)  . COLONOSCOPY  01/24/2018  . MAMMOGRAM  03/05/2018  . TETANUS/TDAP  02/13/2020  . INFLUENZA VACCINE  Completed  . Hepatitis C Screening  Completed  . PNA vac Low Risk Adult  Completed    Cancer Screenings: Lung: Low Dose CT Chest recommended if Age 80-80 years, 30 pack-year currently smoking OR have quit w/in 15years. Patient does not qualify. Breast:  Up to date on Mammogram? Yes, completed 03/21/2016  Up to date of Bone Density/Dexa? No, Patient declined at this time Colorectal: colonoscopy completed 01/25/2015  Additional Screenings:  Hepatitis B/HIV/Syphillis: not indicated  Hepatitis C Screening: completed 02/21/2016  Patient non fasting- will complete blood work at next office visit. Advised to fast.     Plan:   I have personally reviewed and noted the following in the patient's chart:   . Medical and social history . Use of alcohol, tobacco or illicit drugs  . Current medications and supplements . Functional ability and status . Nutritional status . Physical activity . Advanced directives . List of other  physicians . Hospitalizations, surgeries, and ER visits in previous 12 months . Vitals . Screenings to include cognitive, depression, and falls . Referrals and appointments  In addition, I have reviewed and discussed with patient certain preventive protocols, quality metrics, and best practice recommendations. A written personalized care plan for preventive services as well as general preventive health recommendations were provided to patient.    1. Encounter for Medicare annual wellness exam    Andrez Grime, LPN  06/20/5636

## 2017-03-15 NOTE — Patient Instructions (Addendum)
Lisa Blair , Thank you for taking time to come for your Medicare Wellness Visit. I appreciate your ongoing commitment to your health goals. Please review the following plan we discussed and let me know if I can assist you in the future.   Screening recommendations/referrals: Colonoscopy: up to date, next due 01/24/2025 Mammogram: up to date, next due 03/21/2018 Bone Density: declined at this time  Recommended yearly ophthalmology/optometry visit for glaucoma screening and checkup Recommended yearly dental visit for hygiene and checkup  Vaccinations: Influenza vaccine: up to date Pneumococcal vaccine: up to date Tdap vaccine: up to date, next due 02/13/2020 Shingles vaccine: Check with your pharmacy about receiving the Shingrix vaccine    Advanced directives: Advance directive discussed with you today. Even though you declined this today please call our office should you change your mind and we can give you the proper paperwork for you to fill out. (You are currently working on this)  Conditions/risks identified: Try to start exercising more on a consistent basis.   Next appointment: 03/18/17 @ 8 am with Dr. Tamala Julian, next Medicare Wellness visit is 03/17/2018 @ 8 am with [place] 65 Years and Older, Female Preventive care refers to lifestyle choices and visits with your health care provider that can promote health and wellness. What does preventive care include?  A yearly physical exam. This is also called an annual well check.  Dental exams once or twice a year.  Routine eye exams. Ask your health care provider how often you should have your eyes checked.  Personal lifestyle choices, including:  Daily care of your teeth and gums.  Regular physical activity.  Eating a healthy diet.  Avoiding tobacco and drug use.  Limiting alcohol use.  Practicing safe sex.  Taking low-dose aspirin every day.  Taking vitamin and mineral supplements as recommended by  your health care provider. What happens during an annual well check? The services and screenings done by your health care provider during your annual well check will depend on your age, overall health, lifestyle risk factors, and family history of disease. Counseling  Your health care provider may ask you questions about your:  Alcohol use.  Tobacco use.  Drug use.  Emotional well-being.  Home and relationship well-being.  Sexual activity.  Eating habits.  History of falls.  Memory and ability to understand (cognition).  Work and work Statistician.  Reproductive health. Screening  You may have the following tests or measurements:  Height, weight, and BMI.  Blood pressure.  Lipid and cholesterol levels. These may be checked every 5 years, or more frequently if you are over 58 years old.  Skin check.  Lung cancer screening. You may have this screening every year starting at age 32 if you have a 30-pack-year history of smoking and currently smoke or have quit within the past 15 years.  Fecal occult blood test (FOBT) of the stool. You may have this test every year starting at age 73.  Flexible sigmoidoscopy or colonoscopy. You may have a sigmoidoscopy every 5 years or a colonoscopy every 10 years starting at age 32.  Hepatitis C blood test.  Hepatitis B blood test.  Sexually transmitted disease (STD) testing.  Diabetes screening. This is done by checking your blood sugar (glucose) after you have not eaten for a while (fasting). You may have this done every 1-3 years.  Bone density scan. This is done to screen for osteoporosis. You may have this done starting at age 105.  Mammogram. This may be done every 1-2 years. Talk to your health care provider about how often you should have regular mammograms. Talk with your health care provider about your test results, treatment options, and if necessary, the need for more tests. Vaccines  Your health care provider may  recommend certain vaccines, such as:  Influenza vaccine. This is recommended every year.  Tetanus, diphtheria, and acellular pertussis (Tdap, Td) vaccine. You may need a Td booster every 10 years.  Zoster vaccine. You may need this after age 5.  Pneumococcal 13-valent conjugate (PCV13) vaccine. One dose is recommended after age 41.  Pneumococcal polysaccharide (PPSV23) vaccine. One dose is recommended after age 14. Talk to your health care provider about which screenings and vaccines you need and how often you need them. This information is not intended to replace advice given to you by your health care provider. Make sure you discuss any questions you have with your health care provider. Document Released: 02/25/2015 Document Revised: 10/19/2015 Document Reviewed: 11/30/2014 Elsevier Interactive Patient Education  2017 [place] Prevention in the Home Falls can cause injuries. They can happen to people of all ages. There are many things you can do to make your home safe and to help prevent falls. What can I do on the outside of my home?  Regularly fix the edges of walkways and driveways and fix any cracks.  Remove anything that might make you trip as you walk through a door, such as a raised step or threshold.  Trim any bushes or trees on the path to your home.  Use bright outdoor lighting.  Clear any walking paths of anything that might make someone trip, such as rocks or tools.  Regularly check to see if handrails are loose or broken. Make sure that both sides of any steps have handrails.  Any raised decks and porches should have guardrails on the edges.  Have any leaves, snow, or ice cleared regularly.  Use sand or salt on walking paths during winter.  Clean up any spills in your garage right away. This includes oil or grease spills. What can I do in the bathroom?  Use night lights.  Install grab bars by the toilet and in the tub and shower. Do not use towel  bars as grab bars.  Use non-skid mats or decals in the tub or shower.  If you need to sit down in the shower, use a plastic, non-slip stool.  Keep the floor dry. Clean up any water that spills on the floor as soon as it happens.  Remove soap buildup in the tub or shower regularly.  Attach bath mats securely with double-sided non-slip rug tape.  Do not have throw rugs and other things on the floor that can make you trip. What can I do in the bedroom?  Use night lights.  Make sure that you have a light by your bed that is easy to reach.  Do not use any sheets or blankets that are too big for your bed. They should not hang down onto the floor.  Have a firm chair that has side arms. You can use this for support while you get dressed.  Do not have throw rugs and other things on the floor that can make you trip. What can I do in the kitchen?  Clean up any spills right away.  Avoid walking on wet floors.  Keep items that you use a lot in easy-to-reach places.  If you need to reach  something above you, use a strong step stool that has a grab bar.  Keep electrical cords out of the way.  Do not use floor polish or wax that makes floors slippery. If you must use wax, use non-skid floor wax.  Do not have throw rugs and other things on the floor that can make you trip. What can I do with my stairs?  Do not leave any items on the stairs.  Make sure that there are handrails on both sides of the stairs and use them. Fix handrails that are broken or loose. Make sure that handrails are as long as the stairways.  Check any carpeting to make sure that it is firmly attached to the stairs. Fix any carpet that is loose or worn.  Avoid having throw rugs at the top or bottom of the stairs. If you do have throw rugs, attach them to the floor with carpet tape.  Make sure that you have a light switch at the top of the stairs and the bottom of the stairs. If you do not have them, ask someone to  add them for you. What else can I do to help prevent falls?  Wear shoes that:  Do not have high heels.  Have rubber bottoms.  Are comfortable and fit you well.  Are closed at the toe. Do not wear sandals.  If you use a stepladder:  Make sure that it is fully opened. Do not climb a closed stepladder.  Make sure that both sides of the stepladder are locked into place.  Ask someone to hold it for you, if possible.  Clearly mark and make sure that you can see:  Any grab bars or handrails.  First and last steps.  Where the edge of each step is.  Use tools that help you move around (mobility aids) if they are needed. These include:  Canes.  Walkers.  Scooters.  Crutches.  Turn on the lights when you go into a dark area. Replace any light bulbs as soon as they burn out.  Set up your furniture so you have a clear path. Avoid moving your furniture around.  If any of your floors are uneven, fix them.  If there are any pets around you, be aware of where they are.  Review your medicines with your doctor. Some medicines can make you feel dizzy. This can increase your chance of falling. Ask your doctor what other things that you can do to help prevent falls. This information is not intended to replace advice given to you by your health care provider. Make sure you discuss any questions you have with your health care provider. Document Released: 11/25/2008 Document Revised: 07/07/2015 Document Reviewed: 03/05/2014 Elsevier Interactive Patient Education  2017 [employer].

## 2017-03-18 ENCOUNTER — Encounter: Payer: Self-pay | Admitting: Family Medicine

## 2017-03-18 ENCOUNTER — Ambulatory Visit (INDEPENDENT_AMBULATORY_CARE_PROVIDER_SITE_OTHER): Payer: Medicare HMO | Admitting: Family Medicine

## 2017-03-18 VITALS — BP 132/82 | HR 78 | Temp 97.8°F | Resp 16 | Ht 71.26 in | Wt 241.0 lb

## 2017-03-18 DIAGNOSIS — E669 Obesity, unspecified: Secondary | ICD-10-CM

## 2017-03-18 DIAGNOSIS — Z862 Personal history of diseases of the blood and blood-forming organs and certain disorders involving the immune mechanism: Secondary | ICD-10-CM | POA: Diagnosis not present

## 2017-03-18 DIAGNOSIS — K219 Gastro-esophageal reflux disease without esophagitis: Secondary | ICD-10-CM | POA: Diagnosis not present

## 2017-03-18 DIAGNOSIS — I10 Essential (primary) hypertension: Secondary | ICD-10-CM

## 2017-03-18 DIAGNOSIS — E7800 Pure hypercholesterolemia, unspecified: Secondary | ICD-10-CM

## 2017-03-18 DIAGNOSIS — Z0000 Encounter for general adult medical examination without abnormal findings: Secondary | ICD-10-CM

## 2017-03-18 DIAGNOSIS — R7303 Prediabetes: Secondary | ICD-10-CM | POA: Diagnosis not present

## 2017-03-18 MED ORDER — OXYBUTYNIN CHLORIDE 5 MG PO TABS: ORAL_TABLET | ORAL | 3 refills | Status: DC

## 2017-03-18 MED ORDER — LOSARTAN POTASSIUM 50 MG PO TABS: 50.0000 mg | ORAL_TABLET | Freq: Every day | ORAL | 3 refills | Status: DC

## 2017-03-18 NOTE — Progress Notes (Signed)
Subjective:    Patient ID: Lisa Blair, female    DOB: 04-23-1950, 67 y.o.   MRN: [id number]  03/18/2017  Annual Exam    HPI This 67 y.o. female presents for evaluation of Routine Physical Examination.  Last physical: 02-21-2016 CPE; AWV 03-15-17 Pap smear:  hysterectomy Mammogram:  02-2016 Colonoscopy:  2016 Bone density: none   Visual Acuity Screening   Right eye Left eye Both eyes  Without correction: 20/25 20/20 20/20   With correction:       BP Readings from Last 3 Encounters:  03/18/17 132/82  03/15/17 140/84  09/04/16 122/78   Wt Readings from Last 3 Encounters:  03/18/17 241 lb (109.3 kg)  03/15/17 241 lb (109.3 kg)  09/04/16 240 lb (108.9 kg)   Immunization History  Administered Date(s) Administered  . Influenza Split 02/12/2010, 12/26/2011  . Influenza,inj,Quad PF,6+ Mos 11/17/2014  . Influenza-Unspecified 11/30/2013, 01/13/2016, 11/13/2016  . Pneumococcal Conjugate-13 11/17/2014  . Pneumococcal Polysaccharide-23 02/21/2016, 11/13/2016  . Td 02/12/2010  . Zoster 02/12/2010   Health Maintenance  Topic Date Due  . DEXA SCAN  03/15/2018 (Originally 06/28/2014)  . COLONOSCOPY  01/24/2018  . MAMMOGRAM  03/05/2018  . TETANUS/TDAP  02/13/2020  . INFLUENZA VACCINE  Completed  . Hepatitis C Screening  Completed  . PNA vac Low Risk Adult  Completed    Management changes made at last visit include the following: -new onset nighttime cough; ddx includes GERD versus allergic rhinitis; recommend Zantac qhs for GERD induced cough. -obtain labs; blood pressure well controlled; refill provided. -obtain u/a today   HTN: Patient reports good compliance with medication, good tolerance to medication, and good symptom control.  Ranges 120s/70s-150s/90s.    Urge Incontinence: 1/2 tablet at bedtime.  GERD: taking Zantac once daily at bedtime.   Cough gone.    R ankle pain lateral: no swelling; no injury; wearing a brace for support.  Pani with walking and at bedtime.   No major issue.     Review of Systems  Constitutional: Negative for activity change, appetite change, chills, diaphoresis, fatigue, fever and unexpected weight change.  HENT: Negative for congestion, dental problem, drooling, ear discharge, ear pain, facial swelling, hearing loss, mouth sores, nosebleeds, postnasal drip, rhinorrhea, sinus pressure, sneezing, sore throat, tinnitus, trouble swallowing and voice change.   Eyes: Negative for photophobia, pain, discharge, redness, itching and visual disturbance.  Respiratory: Negative for apnea, cough, choking, chest tightness, shortness of breath, wheezing and stridor.   Cardiovascular: Negative for chest pain, palpitations and leg swelling.  Gastrointestinal: Negative for abdominal distention, abdominal pain, anal bleeding, blood in stool, constipation, diarrhea, nausea, rectal pain and vomiting.  Endocrine: Negative for cold intolerance, heat intolerance, polydipsia, polyphagia and polyuria.  Genitourinary: Negative for decreased urine volume, difficulty urinating, dyspareunia, dysuria, enuresis, flank pain, frequency, genital sores, hematuria, menstrual problem, pelvic pain, urgency, vaginal bleeding, vaginal discharge and vaginal pain.       Nocturia x 4.  Urinary leakage; wears panty liner.    Musculoskeletal: Positive for arthralgias and gait problem. Negative for back pain, joint swelling, myalgias, neck pain and neck stiffness.  Skin: Negative for color change, pallor, rash and wound.  Allergic/Immunologic: Negative for environmental allergies, food allergies and immunocompromised state.  Neurological: Negative for dizziness, tremors, seizures, syncope, facial asymmetry, speech difficulty, weakness, light-headedness, numbness and headaches.  Hematological: Negative for adenopathy. Does not bruise/bleed easily.  Psychiatric/Behavioral: Negative for agitation, behavioral problems, confusion, decreased concentration, dysphoric mood,  hallucinations, self-injury, sleep disturbance and suicidal ideas. The  patient is not nervous/anxious and is not hyperactive.     Past Medical History:  Diagnosis Date  . Blood transfusion without reported diagnosis 1998   due to fibroids  . Glaucoma    in left eye  . Hypertension   . Loss of bladder control    Past Surgical History:  Procedure Laterality Date  . ABDOMINAL HYSTERECTOMY  1998   One ovary intact; DUB; fibroids  . DILATION AND CURETTAGE OF UTERUS     Allergies  Allergen Reactions  . Aleve [Naproxen Sodium]     LIGHTHEADNESS/can't function past 2 days  . Codeine Other (See Comments)    SEVERE HEADACHES  . Ibuprofen Other (See Comments)    UNABLE TO SLEEP  . Scallops [Shellfish Allergy]     Bad headaches  . Soy Allergy     Bad headaches   Current Outpatient Medications on File Prior to Visit  Medication Sig Dispense Refill  . Ascorbic Acid (VITAMIN C) 1000 MG tablet Take 1,000 mg by mouth daily.    Marland Kitchen aspirin 81 MG tablet Take 81 mg by mouth daily.    . cholecalciferol (VITAMIN D) 1000 UNITS tablet Take 1,000 Units by mouth daily.    . cyanocobalamin 1000 MCG tablet Take 1,000 mcg by mouth daily.    Marland Kitchen latanoprost (XALATAN) 0.005 % ophthalmic solution Place 1 drop into the left eye at bedtime.    . Pyridoxine HCl (VITAMIN B-6 PO) Take 10 mg by mouth daily.    . ranitidine (ZANTAC) 75 MG tablet Take by mouth.    . [DISCONTINUED] oxybutynin (DITROPAN) 5 MG tablet TAKE 1/2 TABLET BY MOUTH AT BEDTIME OR 1/2 TABLET TWICE A DAY FOR BLADDER 30 tablet 3   No current facility-administered medications on file prior to visit.    Social History   Socioeconomic History  . Marital status: Married    Spouse name: Not on file  . Number of children: 1  . Years of education: Not on file  . Highest education level: Bachelor's degree (e.g., BA, AB, BS)  Social Needs  . Financial resource strain: Not hard at all  . Food insecurity - worry: Never true  . Food insecurity -  inability: Never true  . Transportation needs - medical: No  . Transportation needs - non-medical: No  Occupational History  . Not on file  Tobacco Use  . Smoking status: Never Smoker  . Smokeless tobacco: Never Used  Substance and Sexual Activity  . Alcohol use: Yes    Comment: 4 glasses of wine a month   . Drug use: No  . Sexual activity: Not on file  Other Topics Concern  . Not on file  Social History Narrative   Marital status:  Married x 25 years; happily married ;second marriage.  Husband is 90 years old in 2018.      Children: one child (23); 2 grandchildren (66, 2); in [place].      Lives: with husband.      Employment: retired 25 years ago; retired from [employer]; IT trainer.      Tobacco: none; raised tobacco as a child.      Alcohol:  One glass per week    Education: college.       Exercise: Walking; sporadic.        ADLs: independent with ADLs; drives; no assistant devices.      Advanced Directives:  None; FULL CODE.  2018.   Family History  Problem Relation Age of Onset  .  Cancer Father 29       pancreatic cancer  . Multiple sclerosis Son   . Cancer Sister        breast cancer with mets       Objective:    BP 132/82   Pulse 78   Temp 97.8 F (36.6 C) (Oral)   Resp 16   Ht 5' 11.26" (1.81 m)   Wt 241 lb (109.3 kg)   SpO2 95%   BMI 33.37 kg/m  Physical Exam  Constitutional: She is oriented to person, place, and time. She appears well-developed and well-nourished. No distress.  HENT:  Head: Normocephalic and atraumatic.  Right Ear: Hearing, tympanic membrane, external ear and ear canal normal.  Left Ear: Hearing, tympanic membrane, external ear and ear canal normal.  Nose: Nose normal.  Mouth/Throat: Oropharynx is clear and moist.  Eyes: Conjunctivae and EOM are normal. Pupils are equal, round, and reactive to light.  Neck: Normal range of motion and full passive range of motion without pain. Neck supple. No JVD present. Carotid bruit is not  present. No thyromegaly present.  Cardiovascular: Normal rate, regular rhythm, normal heart sounds and intact distal pulses. Exam reveals no gallop and no friction rub.  No murmur heard. Pulmonary/Chest: Effort normal and breath sounds normal. No respiratory distress. She has no wheezes. She has no rales. Right breast exhibits no inverted nipple, no mass, no nipple discharge, no skin change and no tenderness. Left breast exhibits no inverted nipple, no mass, no nipple discharge, no skin change and no tenderness. Breasts are symmetrical.  Abdominal: Soft. Bowel sounds are normal. She exhibits no distension and no mass. There is no tenderness. There is no rebound and no guarding.  Musculoskeletal:       Right shoulder: Normal.       Left shoulder: Normal.       Cervical back: Normal.  Lymphadenopathy:    She has no cervical adenopathy.  Neurological: She is alert and oriented to person, place, and time. She has normal reflexes. No cranial nerve deficit. She exhibits normal muscle tone. Coordination normal.  Skin: Skin is warm and dry. No rash noted. She is not diaphoretic. No erythema. No pallor.  Psychiatric: She has a normal mood and affect. Her behavior is normal. Judgment and thought content normal.  Nursing note and vitals reviewed.  No results found. Depression screen [facility] 2/9 03/18/2017 03/15/2017 09/04/2016 02/21/2016 11/17/2014  Decreased Interest 0 0 0 0 0  Down, Depressed, Hopeless 0 0 0 0 0  PHQ - 2 Score 0 0 0 0 0   Fall Risk  03/18/2017 03/15/2017 09/04/2016 02/21/2016 11/17/2014  Falls in the past year? No No No No No        Assessment & Plan:   1. Routine physical examination   2. Essential hypertension   3. History of anemia   4. Pre-diabetes   5. Obesity (BMI 30.0-34.9)   6. Pure hypercholesterolemia    -anticipatory guidance provided --- exercise, weight loss, safe driving practices, aspirin 81mg  daily. -obtain age appropriate screening labs and labs for chronic disease  management. -moderate fall risk; no evidence of depression; no evidence of hearing loss.  Discussed advanced directives and living will; also discussed end of life issues including code status.  -Blood pressure well controlled on current regimen.  Obtain labs for chronic disease management.  Refill of losartan 50 mg 1 tablet daily provided. -Hypercholesterolemia managed by dietary modification.  Obtain labs.   I recommend weight loss, exercise,  and low-cholesterol low-fat food choices.  I recommend limiting red meat to once per week; I recommend limiting fried foods to once per month. -Overactive bladder: Well controlled with oxybutynin.  Can increase to 1/2 tablet twice daily. -Prediabetes: Obtain labs.   I recommend weight loss, exercise, and low-carbohydrate low-sugar food choices. You should AVOID: regular sodas, sweetened tea, fruit juices.  You should LIMIT: breads, pastas, rice, potatoes, and desserts/sweets.  I would recommend limiting your total carbohydrate intake per meal to 45 grams; I would limit your total carbohydrate intake per snack to 30 grams.  I would also have a goal of 60 grams of protein intake per day; this would equal 10-15 grams of protein per meal and 5-10 grams of protein per snack. -obesity: -recommend weight loss, exercise for 30-60 minutes five days per week; recommend 1200 kcal restriction per day with a minimum of 60 grams of protein per day. -GERD: well controlled with Zantac qhs; cough has resolved.   Orders Placed This Encounter  Procedures  . CBC with Differential/Platelet  . Comprehensive metabolic panel    Order Specific Question:   Has the patient fasted?    Answer:   No  . Hemoglobin A1c  . Lipid panel    Order Specific Question:   Has the patient fasted?    Answer:   No  . TSH   Meds ordered this encounter  Medications  . oxybutynin (DITROPAN) 5 MG tablet    Sig: TAKE 1/2 TABLET AT BEDTIME, OR 1/2 TAB TWICE A DAY FOR BLADDER.    Dispense:  90 tablet     Refill:  3    Office  . losartan (COZAAR) 50 MG tablet    Sig: Take 1 tablet (50 mg total) by mouth daily.    Dispense:  90 tablet    Refill:  3    Return in about 6 months (around 09/15/2017) for follow-up chronic medical conditions.   Anzel Kearse Elayne Guerin, M.D. Primary Care at [facility] previously [facility] [address] [place], [place]  [address] [phone] phone [phone] fax

## 2017-03-18 NOTE — Patient Instructions (Addendum)
IF you received an x-ray today, you will receive an invoice from [facility]. Please contact [facility] at [phone] with questions or concerns regarding your invoice.   IF you received labwork today, you will receive an invoice from [place]. Please contact [facility] at [phone] with questions or concerns regarding your invoice.   Our billing staff will not be able to assist you with questions regarding bills from these companies.  You will be contacted with the lab results as soon as they are available. The fastest way to get your results is to activate your My Chart account. Instructions are located on the last page of this paperwork. If you have not heard from [place] regarding the results in 2 weeks, please contact this office.     Ankle Sprain, Phase I Rehab Ask your health care provider which exercises are safe for you. Do exercises exactly as told by your health care provider and adjust them as directed. It is normal to feel mild stretching, pulling, tightness, or discomfort as you do these exercises, but you should stop right away if you feel sudden pain or your pain gets worse.Do not begin these exercises until told by your health care provider. Stretching and range of motion exercises These exercises warm up your muscles and joints and improve the movement and flexibility of your lower leg and ankle. These exercises also help to relieve pain and stiffness. Exercise A: Gastroc and soleus stretch  1. Sit on the floor with your left / right leg extended. 2. Loop a belt or towel around the ball of your left / right foot. The ball of your foot is on the walking surface, right under your toes. 3. Keep your left / right ankle and foot relaxed and keep your knee straight while you use the belt or towel to pull your foot toward you. You should feel a gentle stretch behind your calf or knee. 4. Hold this position for __________ seconds, then release to the starting  position. Repeat the exercise with your knee bent. You can put a pillow or a rolled bath towel under your knee to support it. You should feel a stretch deep in your calf or at your Achilles tendon. Repeat each stretch __________ times. Complete these stretches __________ times a day. Exercise B: Ankle alphabet  1. Sit with your left / right leg supported at the lower leg. ? Do not rest your foot on anything. ? Make sure your foot has room to move freely. 2. Think of your left / right foot as a paintbrush, and move your foot to trace each letter of the alphabet in the air. Keep your hip and knee still while you trace. Make the letters as large as you can without feeling discomfort. 3. Trace every letter from A to Z. Repeat __________ times. Complete this exercise __________ times a day. Strengthening exercises These exercises build strength and endurance in your ankle and lower leg. Endurance is the ability to use your muscles for a long time, even after they get tired. Exercise C: Dorsiflexors  1. Secure a rubber exercise band or tube to an object, such as a table leg, that will stay still when the band is pulled. Secure the other end around your left / right foot. 2. Sit on the floor facing the object, with your left / right leg extended. The band or tube should be slightly tense when your foot is relaxed. 3. Slowly bring your foot toward you, pulling the  band tighter. 4. Hold this position for __________ seconds. 5. Slowly return your foot to the starting position. Repeat __________ times. Complete this exercise __________ times a day. Exercise D: Plantar flexors  1. Sit on the floor with your left / right leg extended. 2. Loop a rubber exercise tube or band around the ball of your left / right foot. The ball of your foot is on the walking surface, right under your toes. ? Hold the ends of the band or tube in your hands. ? The band or tube should be slightly tense when your foot is  relaxed. 3. Slowly point your foot and toes downward, pushing them away from you. 4. Hold this position for __________ seconds. 5. Slowly return your foot to the starting position. Repeat __________ times. Complete this exercise __________ times a day. Exercise E: Evertors 1. Sit on the floor with your legs straight out in front of you. 2. Loop a rubber exercise band or tube around the ball of your left / right foot. The ball of your foot is on the walking surface, right under your toes. ? Hold the ends of the band in your hands, or secure the band to a stable object. ? The band or tube should be slightly tense when your foot is relaxed. 3. Slowly push your foot outward, away from your other leg. 4. Hold this position for __________ seconds. 5. Slowly return your foot to the starting position. Repeat __________ times. Complete this exercise __________ times a day. This information is not intended to replace advice given to you by your health care provider. Make sure you discuss any questions you have with your health care provider. Document Released: 08/30/2004 Document Revised: 10/06/2015 Document Reviewed: 12/13/2014 Elsevier Interactive Patient Education  2018 [place] 65 Years and Older, Female Preventive care refers to lifestyle choices and visits with your health care provider that can promote health and wellness. What does preventive care include?  A yearly physical exam. This is also called an annual well check.  Dental exams once or twice a year.  Routine eye exams. Ask your health care provider how often you should have your eyes checked.  Personal lifestyle choices, including: ? Daily care of your teeth and gums. ? Regular physical activity. ? Eating a healthy diet. ? Avoiding tobacco and drug use. ? Limiting alcohol use. ? Practicing safe sex. ? Taking low-dose aspirin every day. ? Taking vitamin and mineral supplements as recommended by your  health care provider. What happens during an annual well check? The services and screenings done by your health care provider during your annual well check will depend on your age, overall health, lifestyle risk factors, and family history of disease. Counseling Your health care provider may ask you questions about your:  Alcohol use.  Tobacco use.  Drug use.  Emotional well-being.  Home and relationship well-being.  Sexual activity.  Eating habits.  History of falls.  Memory and ability to understand (cognition).  Work and work Statistician.  Reproductive health.  Screening You may have the following tests or measurements:  Height, weight, and BMI.  Blood pressure.  Lipid and cholesterol levels. These may be checked every 5 years, or more frequently if you are over 63 years old.  Skin check.  Lung cancer screening. You may have this screening every year starting at age 14 if you have a 30-pack-year history of smoking and currently smoke or have quit within the past 15 years.  Fecal occult blood test (FOBT) of the stool. You may have this test every year starting at age 60.  Flexible sigmoidoscopy or colonoscopy. You may have a sigmoidoscopy every 5 years or a colonoscopy every 10 years starting at age 88.  Hepatitis C blood test.  Hepatitis B blood test.  Sexually transmitted disease (STD) testing.  Diabetes screening. This is done by checking your blood sugar (glucose) after you have not eaten for a while (fasting). You may have this done every 1-3 years.  Bone density scan. This is done to screen for osteoporosis. You may have this done starting at age 18.  Mammogram. This may be done every 1-2 years. Talk to your health care provider about how often you should have regular mammograms.  Talk with your health care provider about your test results, treatment options, and if necessary, the need for more tests. Vaccines Your health care provider may recommend  certain vaccines, such as:  Influenza vaccine. This is recommended every year.  Tetanus, diphtheria, and acellular pertussis (Tdap, Td) vaccine. You may need a Td booster every 10 years.  Varicella vaccine. You may need this if you have not been vaccinated.  Zoster vaccine. You may need this after age 63.  Measles, mumps, and rubella (MMR) vaccine. You may need at least one dose of MMR if you were born in 1957 or later. You may also need a second dose.  Pneumococcal 13-valent conjugate (PCV13) vaccine. One dose is recommended after age 1.  Pneumococcal polysaccharide (PPSV23) vaccine. One dose is recommended after age 8.  Meningococcal vaccine. You may need this if you have certain conditions.  Hepatitis A vaccine. You may need this if you have certain conditions or if you travel or work in places where you may be exposed to hepatitis A.  Hepatitis B vaccine. You may need this if you have certain conditions or if you travel or work in places where you may be exposed to hepatitis B.  Haemophilus influenzae type b (Hib) vaccine. You may need this if you have certain conditions.  Talk to your health care provider about which screenings and vaccines you need and how often you need them. This information is not intended to replace advice given to you by your health care provider. Make sure you discuss any questions you have with your health care provider. Document Released: 02/25/2015 Document Revised: 10/19/2015 Document Reviewed: 11/30/2014 Elsevier Interactive Patient Education  [employer].

## 2017-03-19 ENCOUNTER — Encounter: Payer: Medicare HMO | Admitting: Family Medicine

## 2017-03-19 LAB — COMPREHENSIVE METABOLIC PANEL
A/G RATIO: 1.5 (ref 1.2–2.2)
ALBUMIN: 4.1 g/dL (ref 3.6–4.8)
ALT: 29 IU/L (ref 0–32)
AST: 21 IU/L (ref 0–40)
Alkaline Phosphatase: 66 IU/L (ref 39–117)
BILIRUBIN TOTAL: 0.6 mg/dL (ref 0.0–1.2)
BUN/Creatinine Ratio: 21 (ref 12–28)
BUN: 12 mg/dL (ref 8–27)
CHLORIDE: 106 mmol/L (ref 96–106)
CO2: 19 mmol/L — ABNORMAL LOW (ref 20–29)
Calcium: 8.7 mg/dL (ref 8.7–10.3)
Creatinine, Ser: 0.58 mg/dL (ref 0.57–1.00)
GFR calc Af Amer: 110 mL/min/{1.73_m2} (ref >59)
GFR calc non Af Amer: 96 mL/min/{1.73_m2} (ref >59)
GLUCOSE: 92 mg/dL (ref 65–99)
Globulin, Total: 2.7 g/dL (ref 1.5–4.5)
Potassium: 4.3 mmol/L (ref 3.5–5.2)
Sodium: 143 mmol/L (ref 134–144)
TOTAL PROTEIN: 6.8 g/dL (ref 6.0–8.5)

## 2017-03-19 LAB — CBC WITH DIFFERENTIAL/PLATELET
BASOS: 0 %
Basophils Absolute: 0 10*3/uL (ref 0.0–0.2)
EOS (ABSOLUTE): 0.2 10*3/uL (ref 0.0–0.4)
EOS: 3 %
HEMATOCRIT: 39.5 % (ref 34.0–46.6)
HEMOGLOBIN: 13.6 g/dL (ref 11.1–15.9)
IMMATURE GRANS (ABS): 0 10*3/uL (ref 0.0–0.1)
Immature Granulocytes: 0 %
LYMPHS: 32 %
Lymphocytes Absolute: 1.6 10*3/uL (ref 0.7–3.1)
MCH: 30.1 pg (ref 26.6–33.0)
MCHC: 34.4 g/dL (ref 31.5–35.7)
MCV: 87 fL (ref 79–97)
Monocytes Absolute: 0.4 10*3/uL (ref 0.1–0.9)
Monocytes: 8 %
NEUTROS ABS: 2.9 10*3/uL (ref 1.4–7.0)
Neutrophils: 57 %
Platelets: 196 10*3/uL (ref 150–379)
RBC: 4.52 x10E6/uL (ref 3.77–5.28)
RDW: 14.2 % (ref 12.3–15.4)
WBC: 5.2 10*3/uL (ref 3.4–10.8)

## 2017-03-19 LAB — HEMOGLOBIN A1C
Est. average glucose Bld gHb Est-mCnc: 120 mg/dL
Hgb A1c MFr Bld: 5.8 % — ABNORMAL HIGH (ref 4.8–5.6)

## 2017-03-19 LAB — LIPID PANEL
CHOL/HDL RATIO: 4.2 ratio (ref 0.0–4.4)
Cholesterol, Total: 175 mg/dL (ref 100–199)
HDL: 42 mg/dL (ref >39)
LDL CALC: 106 mg/dL — AB (ref 0–99)
TRIGLYCERIDES: 134 mg/dL (ref 0–149)
VLDL Cholesterol Cal: 27 mg/dL (ref 5–40)

## 2017-03-19 LAB — TSH: TSH: 3.95 u[IU]/mL (ref 0.450–4.500)

## 2017-04-07 ENCOUNTER — Encounter: Payer: Self-pay | Admitting: Family Medicine

## 2017-04-07 DIAGNOSIS — E7800 Pure hypercholesterolemia, unspecified: Secondary | ICD-10-CM | POA: Insufficient documentation

## 2017-04-07 DIAGNOSIS — K219 Gastro-esophageal reflux disease without esophagitis: Secondary | ICD-10-CM | POA: Insufficient documentation

## 2017-05-06 ENCOUNTER — Encounter: Payer: Self-pay | Admitting: Physician Assistant

## 2017-05-06 ENCOUNTER — Other Ambulatory Visit: Payer: Self-pay

## 2017-05-06 ENCOUNTER — Ambulatory Visit (INDEPENDENT_AMBULATORY_CARE_PROVIDER_SITE_OTHER): Payer: Medicare HMO | Admitting: Physician Assistant

## 2017-05-06 VITALS — BP 151/86 | HR 91 | Temp 98.5°F | Resp 16 | Ht 71.0 in | Wt 242.8 lb

## 2017-05-06 DIAGNOSIS — R0981 Nasal congestion: Secondary | ICD-10-CM | POA: Diagnosis not present

## 2017-05-06 MED ORDER — LORATADINE 10 MG PO TABS: 10.0000 mg | ORAL_TABLET | Freq: Every day | ORAL | 11 refills | Status: AC

## 2017-05-06 MED ORDER — OXYMETAZOLINE HCL 0.05 % NA SOLN: 1.0000 | Freq: Every day | NASAL | 0 refills | Status: AC

## 2017-05-06 MED ORDER — HYDROCODONE-HOMATROPINE 5-1.5 MG/5ML PO SYRP: 2.5000 mL | ORAL_SOLUTION | Freq: Every day | ORAL | 0 refills | Status: AC

## 2017-05-06 MED ORDER — ONDANSETRON HCL 4 MG PO TABS: 4.0000 mg | ORAL_TABLET | Freq: Three times a day (TID) | ORAL | 0 refills | Status: AC | PRN

## 2017-05-06 NOTE — Patient Instructions (Addendum)
Please use the Afrin for 3 nights only so you can rest with a clear nose.  Given you some cough syrup is waiting at your pharmacy that will also help you get some rest at night.  Please drink lots of fluids and is okay to continue the Mucinex DM.  I would add in Claritin given that this is possibly allergies.  Your nausea is very nonspecific at this point.  Giving you some medication to help mediate this.  If you begin to have abdominal pain, fever, emesis, diarrhea please come back in.    IF you received an x-ray today, you will receive an invoice from [facility]. Please contact [facility] at [phone] with questions or concerns regarding your invoice.   IF you received labwork today, you will receive an invoice from [place]. Please contact [facility] at [phone] with questions or concerns regarding your invoice.   Our billing staff will not be able to assist you with questions regarding bills from these companies.  You will be contacted with the lab results as soon as they are available. The fastest way to get your results is to activate your My Chart account. Instructions are located on the last page of this paperwork. If you have not heard from [place] regarding the results in 2 weeks, please contact this office.

## 2017-05-06 NOTE — Progress Notes (Signed)
05/06/2017 10:26 AM   DOB: 09/13/1950 / MRN: [id number]  SUBJECTIVE:  Lisa Blair is a 67 y.o. female presenting for nasal congestion times 12 hours.  She denies fever, teeth pain, headache.  Feels she is getting worse.  No new foods.  Associates nausea.  Denies abdominal pain.  Did not sleep well last night secondary to nasal congestion along with cough.  Has only tried Mucinex.  She is allergic to aleve [naproxen sodium]; codeine; ibuprofen; scallops [shellfish allergy]; and soy allergy.   She  has a past medical history of Blood transfusion without reported diagnosis (1998), Glaucoma, Hypertension, and Loss of bladder control.    She  reports that she has never smoked. She has never used smokeless tobacco. She reports that she drinks alcohol. She reports that she does not use drugs. She  has no sexual activity history on file. The patient  has a past surgical history that includes Dilation and curettage of uterus and Abdominal hysterectomy (1998).  Her family history includes Cancer in her sister; Cancer (age of onset: 85) in her father; Multiple sclerosis in her son.  Review of Systems  Gastrointestinal: Positive for nausea. Negative for abdominal pain, constipation and vomiting.  Genitourinary: Negative for dysuria, flank pain, frequency, hematuria and urgency.    The problem list and medications were reviewed and updated by myself where necessary and exist elsewhere in the encounter.   OBJECTIVE:  BP (!) 151/86   Pulse 91   Temp 98.5 F (36.9 C) (Oral)   Resp 16   Ht 5\' 11"  (2.353 m)   Wt 242 lb 12.8 oz (110.1 kg)   SpO2 95%   BMI 33.86 kg/m   Wt Readings from Last 3 Encounters:  05/06/17 242 lb 12.8 oz (110.1 kg)  03/18/17 241 lb (109.3 kg)  03/15/17 241 lb (109.3 kg)   Temp Readings from Last 3 Encounters:  05/06/17 98.5 F (36.9 C) (Oral)  03/18/17 97.8 F (36.6 C) (Oral)  03/15/17 98.1 F (36.7 C) (Oral)   BP Readings from Last 3 Encounters:  05/06/17 (!)  151/86  03/18/17 132/82  03/15/17 140/84   Pulse Readings from Last 3 Encounters:  05/06/17 91  03/18/17 78  03/15/17 89     Physical Exam  Constitutional: She is oriented to person, place, and time. She is active.  Non-toxic appearance.  Cardiovascular: Normal rate, regular rhythm, S1 normal, S2 normal, normal heart sounds and intact distal pulses. Exam reveals no gallop, no friction rub and no decreased pulses.  No murmur heard. Pulmonary/Chest: Effort normal. No stridor. No tachypnea. No respiratory distress. She has no wheezes. She has no rales.  Abdominal: Soft. Bowel sounds are normal. She exhibits no distension and no mass. There is no tenderness. There is no rebound and no guarding.  Musculoskeletal: Normal range of motion. She exhibits no edema, tenderness or deformity.  Neurological: She is alert and oriented to person, place, and time.  Skin: Skin is warm and dry. She is not diaphoretic. No pallor.    No results found for this or any previous visit (from the past 72 hour(s)).  No results found.  ASSESSMENT AND PLAN:  Candela was seen today for nasal congestion, headache and feeling nausea.  Diagnoses and all orders for this visit:  Nasal congestion Comments: Symptoms started last night.  She is tried Mucinex D she is not sleeping well and associates cough.  Hycodan plus Claritin plus Afrin times 3 days only    The patient  is advised to call or return to clinic if she does not see an improvement in symptoms, or to seek the care of the closest emergency department if she worsens with the above plan.   Philis Fendt, MHS, PA-C [facility] 05/06/2017 10:26 AM

## 2017-05-14 DIAGNOSIS — H02833 Dermatochalasis of right eye, unspecified eyelid: Secondary | ICD-10-CM | POA: Diagnosis not present

## 2017-05-14 DIAGNOSIS — H52209 Unspecified astigmatism, unspecified eye: Secondary | ICD-10-CM | POA: Diagnosis not present

## 2017-05-14 DIAGNOSIS — H40001 Preglaucoma, unspecified, right eye: Secondary | ICD-10-CM | POA: Diagnosis not present

## 2017-05-14 DIAGNOSIS — Z79899 Other long term (current) drug therapy: Secondary | ICD-10-CM | POA: Diagnosis not present

## 2017-05-14 DIAGNOSIS — H401221 Low-tension glaucoma, left eye, mild stage: Secondary | ICD-10-CM | POA: Diagnosis not present

## 2017-05-14 DIAGNOSIS — H5213 Myopia, bilateral: Secondary | ICD-10-CM | POA: Diagnosis not present

## 2017-05-14 DIAGNOSIS — H2513 Age-related nuclear cataract, bilateral: Secondary | ICD-10-CM | POA: Diagnosis not present

## 2017-05-14 DIAGNOSIS — H02836 Dermatochalasis of left eye, unspecified eyelid: Secondary | ICD-10-CM | POA: Diagnosis not present

## 2017-07-09 ENCOUNTER — Encounter: Payer: Self-pay | Admitting: Family Medicine

## 2017-08-22 ENCOUNTER — Telehealth: Payer: Self-pay | Admitting: Family Medicine

## 2017-08-22 NOTE — Telephone Encounter (Signed)
Called pt and rescheduled her appt. Her new appt is 09/17/17 at 9:00 am. I informed her of building, time and late policy.

## 2017-09-16 ENCOUNTER — Ambulatory Visit: Payer: Medicare HMO | Admitting: Family Medicine

## 2017-09-17 ENCOUNTER — Ambulatory Visit: Payer: Medicare HMO | Admitting: Family Medicine

## 2017-09-19 ENCOUNTER — Encounter: Payer: Self-pay | Admitting: Family Medicine

## 2017-09-19 ENCOUNTER — Other Ambulatory Visit: Payer: Self-pay

## 2017-09-19 ENCOUNTER — Ambulatory Visit (INDEPENDENT_AMBULATORY_CARE_PROVIDER_SITE_OTHER): Payer: Medicare HMO | Admitting: Family Medicine

## 2017-09-19 VITALS — BP 136/84 | HR 83 | Temp 98.0°F | Resp 16 | Ht 71.0 in | Wt 240.2 lb

## 2017-09-19 DIAGNOSIS — E669 Obesity, unspecified: Secondary | ICD-10-CM

## 2017-09-19 DIAGNOSIS — Z5181 Encounter for therapeutic drug level monitoring: Secondary | ICD-10-CM

## 2017-09-19 DIAGNOSIS — R7303 Prediabetes: Secondary | ICD-10-CM | POA: Diagnosis not present

## 2017-09-19 DIAGNOSIS — E66811 Obesity, class 1: Secondary | ICD-10-CM

## 2017-09-19 DIAGNOSIS — I10 Essential (primary) hypertension: Secondary | ICD-10-CM

## 2017-09-19 NOTE — Patient Instructions (Addendum)
We recommend that you schedule a mammogram for breast cancer screening. Typically, you do not need a referral to do this. Please contact a local imaging center to schedule your mammogram.  [facility] - [phone]  *ask for the Radiology Department [facility] ([place]) - [phone] or [phone]  [facility] - [phone] [place] [phone] [facility] - [phone]  *ask for the [facility] - [phone]  *ask for the Radiology Department [facility] - [phone]  *ask for the [facility] - [phone]    IF you received an x-ray today, you will receive an invoice from [facility] Radiology. Please contact [facility] Radiology at [phone] with questions or concerns regarding your invoice.   IF you received labwork today, you will receive an invoice from [facility]. Please contact [facility] at [phone] with questions or concerns regarding your invoice.   Our billing staff will not be able to assist you with questions regarding bills from these companies.  You will be contacted with the lab results as soon as they are available. The fastest way to get your results is to activate your My Chart account. Instructions are located on the last page of this paperwork. If you have not heard from [place] regarding the results in 2 weeks, please contact this office.     Exercising to Stay Healthy Exercising regularly is important. It has many health benefits, such as:  Improving your overall fitness, flexibility, and endurance.  Increasing your bone density.  Helping with weight control.  Decreasing your body fat.  Increasing your muscle strength.  Reducing stress and tension.  Improving your overall health.  In order to become healthy and stay healthy, it is recommended that you do moderate-intensity and  vigorous-intensity exercise. You can tell that you are exercising at a moderate intensity if you have a higher heart rate and faster breathing, but you are still able to hold a conversation. You can tell that you are exercising at a vigorous intensity if you are breathing much harder and faster and cannot hold a conversation while exercising. How often should I exercise? Choose an activity that you enjoy and set realistic goals. Your health care provider can help you to make an activity plan that works for you. Exercise regularly as directed by your health care provider. This may include:  Doing resistance training twice each week, such as: ? Push-ups. ? Sit-ups. ? Lifting weights. ? Using resistance bands.  Doing a given intensity of exercise for a given amount of time. Choose from these options: ? 150 minutes of moderate-intensity exercise every week. ? 75 minutes of vigorous-intensity exercise every week. ? A mix of moderate-intensity and vigorous-intensity exercise every week.  Children, pregnant women, people who are out of shape, people who are overweight, and older adults may need to consult a health care provider for individual recommendations. If you have any sort of medical condition, be sure to consult your health care provider before starting a new exercise program. What are some exercise ideas? Some moderate-intensity exercise ideas include:  Walking at a rate of 1 mile in 15 minutes.  Biking.  Hiking.  Golfing.  Dancing.  Some vigorous-intensity exercise ideas include:  Walking at a rate of at least 4.5 miles per hour.  Jogging or running at a rate of 5 miles per hour.  Biking at a rate of at least 10 miles per hour.  Lap swimming.  Roller-skating or in-line skating.  Cross-country skiing.  Vigorous competitive sports, such as football, basketball, and soccer.  Jumping rope.  Aerobic dancing.  What are some everyday activities that can help me to get  exercise?  [place] work, such as: ? Pushing a Conservation officer, nature. ? Raking and bagging leaves.  Washing and waxing your car.  Pushing a stroller.  Shoveling snow.  Gardening.  Washing windows or floors. How can I be more active in my day-to-day activities?  Use the stairs instead of the elevator.  Take a walk during your lunch break.  If you drive, park your car farther away from work or school.  If you take public transportation, get off one stop early and walk the rest of the way.  Make all of your phone calls while standing up and walking around.  Get up, stretch, and walk around every 30 minutes throughout the day. What guidelines should I follow while exercising?  Do not exercise so much that you hurt yourself, feel dizzy, or get very short of breath.  Consult your health care provider before starting a new exercise program.  Wear comfortable clothes and shoes with good support.  Drink plenty of water while you exercise to prevent dehydration or heat stroke. Body water is lost during exercise and must be replaced.  Work out until you breathe faster and your heart beats faster. This information is not intended to replace advice given to you by your health care provider. Make sure you discuss any questions you have with your health care provider. Document Released: 03/03/2010 Document Revised: 07/07/2015 Document Reviewed: 07/02/2013 Elsevier Interactive Patient Education  [employer].

## 2017-09-19 NOTE — Progress Notes (Signed)
Chief Complaint  Patient presents with  . former smith pt-f/u for chronic medical conditions    HPI  Hypertension: Patient here for follow-up of elevated blood pressure. She is not exercising and is adherent to low salt diet but admits to canned foods and eating out.  Blood pressure is not well controlled at home. Cardiac symptoms none. Patient denies chest pain, chest pressure/discomfort, claudication, exertional chest pressure/discomfort, fatigue, irregular heart beat, lower extremity edema, near-syncope and orthopnea.  Cardiovascular risk factors: hypertension and obesity (BMI >= 30 kg/m2). Use of agents associated with hypertension: none. History of target organ damage: none. BP Readings from Last 3 Encounters:  09/19/17 136/84  05/06/17 (!) 151/86  03/18/17 132/82   Lab Results  Component Value Date   CREATININE 0.58 03/18/2017   Obesity She is using her fit bit to increase her steps She is eating more healthy foods Wt Readings from Last 3 Encounters:  09/19/17 240 lb 3.2 oz (109 kg)  05/06/17 242 lb 12.8 oz (110.1 kg)  03/18/17 241 lb (109.3 kg)   Prediabetes Lab Results  Component Value Date   HGBA1C 5.8 (H) 03/18/2017   She has urinary incontinence She denies polyuria, polydipsia, and polyphagia She is exercising and trying to keep her weight under control  Past Medical History:  Diagnosis Date  . Blood transfusion without reported diagnosis 1998   due to fibroids  . Glaucoma    in left eye  . Hypertension   . Loss of bladder control     Current Outpatient Medications  Medication Sig Dispense Refill  . Ascorbic Acid (VITAMIN C) 1000 MG tablet Take 1,000 mg by mouth daily.    Marland Kitchen aspirin 81 MG tablet Take 81 mg by mouth daily.    . cholecalciferol (VITAMIN D) 1000 UNITS tablet Take 1,000 Units by mouth daily.    . cyanocobalamin 1000 MCG tablet Take 1,000 mcg by mouth daily.    Marland Kitchen latanoprost (XALATAN) 0.005 % ophthalmic solution Place 1 drop into the left eye  at bedtime.    Marland Kitchen losartan (COZAAR) 50 MG tablet Take 1 tablet (50 mg total) by mouth daily. 90 tablet 3  . oxybutynin (DITROPAN) 5 MG tablet TAKE 1/2 TABLET AT BEDTIME, OR 1/2 TAB TWICE A DAY FOR BLADDER. 90 tablet 3  . Pyridoxine HCl (VITAMIN B-6 PO) Take 10 mg by mouth daily.    . ranitidine (ZANTAC) 75 MG tablet Take by mouth.    . loratadine (CLARITIN) 10 MG tablet Take 1 tablet (10 mg total) by mouth daily. (Patient not taking: Reported on 09/19/2017) 30 tablet 11  . ondansetron (ZOFRAN) 4 MG tablet Take 1 tablet (4 mg total) by mouth every 8 (eight) hours as needed for nausea or vomiting. (Patient not taking: Reported on 09/19/2017) 12 tablet 0  . oxymetazoline (AFRIN) 0.05 % nasal spray Place 1 spray into both nostrils at bedtime. Use for 3 days only (Patient not taking: Reported on 09/19/2017) 30 mL 0   No current facility-administered medications for this visit.     Allergies:  Allergies  Allergen Reactions  . Aleve [Naproxen Sodium]     LIGHTHEADNESS/can't function past 2 days  . Codeine Other (See Comments)    SEVERE HEADACHES  . Ibuprofen Other (See Comments)    UNABLE TO SLEEP  . Scallops [Shellfish Allergy]     Bad headaches  . Soy Allergy     Bad headaches    Past Surgical History:  Procedure Laterality Date  . ABDOMINAL HYSTERECTOMY  1998   One ovary intact; DUB; fibroids  . DILATION AND CURETTAGE OF UTERUS      Social History   Socioeconomic History  . Marital status: Married    Spouse name: Not on file  . Number of children: 1  . Years of education: Not on file  . Highest education level: Bachelor's degree (e.g., BA, AB, BS)  Occupational History  . Not on file  Social Needs  . Financial resource strain: Not hard at all  . Food insecurity:    Worry: Never true    Inability: Never true  . Transportation needs:    Medical: No    Non-medical: No  Tobacco Use  . Smoking status: Never Smoker  . Smokeless tobacco: Never Used  Substance and Sexual Activity    . Alcohol use: Yes    Comment: 4 glasses of wine a month   . Drug use: No  . Sexual activity: Not on file  Lifestyle  . Physical activity:    Days per week: 0 days    Minutes per session: 0 min  . Stress: Not at all  Relationships  . Social connections:    Talks on phone: More than three times a week    Gets together: More than three times a week    Attends religious service: More than 4 times per year    Active member of club or organization: Yes    Attends meetings of clubs or organizations: More than 4 times per year    Relationship status: Married  Other Topics Concern  . Not on file  Social History Narrative   Marital status:  Married x 25 years; happily married ;second marriage.  Husband is 66 years old in 2018.      Children: one child (57); 2 grandchildren (62, 2); in [place].      Lives: with husband.      Employment: retired 25 years ago; retired from [employer]; IT trainer.      Tobacco: none; raised tobacco as a child.      Alcohol:  One glass per week    Education: college.       Exercise: Walking; sporadic.        ADLs: independent with ADLs; drives; no assistant devices.      Advanced Directives:  None; FULL CODE.  2018.    Family History  Problem Relation Age of Onset  . Cancer Father 62       pancreatic cancer  . Multiple sclerosis Son   . Cancer Sister        breast cancer with mets     ROS Review of Systems See HPI Constitution: No fevers or chills No malaise No diaphoresis Skin: No rash or itching Eyes: no blurry vision, no double vision GU: no dysuria or hematuria Neuro: no dizziness or headaches all others reviewed and negative   Objective: Vitals:   09/19/17 0818  BP: 136/84  Pulse: 83  Resp: 16  Temp: 98 F (36.7 C)  TempSrc: Oral  SpO2: 93%  Weight: 240 lb 3.2 oz (109 kg)  Height: 5\' 11"  (1.803 m)    Physical Exam  Constitutional: She is oriented to person, place, and time. She appears well-developed and  well-nourished.  HENT:  Head: Normocephalic and atraumatic.  Eyes: Conjunctivae and EOM are normal.  Cardiovascular: Normal rate, regular rhythm and normal heart sounds.  No murmur heard. Pulmonary/Chest: Effort normal and breath sounds normal. No stridor. No respiratory distress.  Abdominal: Soft.  Bowel sounds are normal. She exhibits no distension and no mass. There is no tenderness. There is no rebound and no guarding. No hernia.  Neurological: She is alert and oriented to person, place, and time.  Skin: Skin is warm. Capillary refill takes less than 2 seconds.  Psychiatric: She has a normal mood and affect. Her behavior is normal. Judgment and thought content normal.    Assessment and Plan Lisa Blair was seen today for former smith pt-f/u for chronic medical conditions.  Diagnoses and all orders for this visit:  Essential hypertension- Patient's blood pressure is at goal of 139/89 or less. Condition is stable. Continue current medications and treatment plan. I recommend that you exercise for 30-45 minutes 5 days a week. I also recommend a balanced diet with fruits and vegetables every day, lean meats, and little fried foods. The DASH diet (you can find this online) is a good example of this.  -     Lipid panel -     Comprehensive metabolic panel  Pre-diabetes- discussed diabetes prevention and prediabetes Obesity- Her obesity is stable so weight loss was encouraged -     Hemoglobin A1c  Encounter for medication monitoring -     Hemoglobin A1c -     Lipid panel -     Comprehensive metabolic panel     Lisa Blair A Lisa Blair

## 2017-09-20 LAB — COMPREHENSIVE METABOLIC PANEL
ALBUMIN: 3.8 g/dL (ref 3.6–4.8)
ALK PHOS: 65 IU/L (ref 39–117)
ALT: 21 IU/L (ref 0–32)
AST: 16 IU/L (ref 0–40)
Albumin/Globulin Ratio: 1.4 (ref 1.2–2.2)
BUN/Creatinine Ratio: 24 (ref 12–28)
BUN: 13 mg/dL (ref 8–27)
Bilirubin Total: 0.4 mg/dL (ref 0.0–1.2)
CO2: 23 mmol/L (ref 20–29)
CREATININE: 0.54 mg/dL — AB (ref 0.57–1.00)
Calcium: 9 mg/dL (ref 8.7–10.3)
Chloride: 107 mmol/L — ABNORMAL HIGH (ref 96–106)
GFR calc Af Amer: 112 mL/min/{1.73_m2} (ref >59)
GFR calc non Af Amer: 97 mL/min/{1.73_m2} (ref >59)
GLUCOSE: 92 mg/dL (ref 65–99)
Globulin, Total: 2.8 g/dL (ref 1.5–4.5)
Potassium: 4.5 mmol/L (ref 3.5–5.2)
Sodium: 144 mmol/L (ref 134–144)
Total Protein: 6.6 g/dL (ref 6.0–8.5)

## 2017-09-20 LAB — LIPID PANEL
Chol/HDL Ratio: 3.9 ratio (ref 0.0–4.4)
Cholesterol, Total: 168 mg/dL (ref 100–199)
HDL: 43 mg/dL (ref >39)
LDL Calculated: 102 mg/dL — ABNORMAL HIGH (ref 0–99)
Triglycerides: 113 mg/dL (ref 0–149)
VLDL CHOLESTEROL CAL: 23 mg/dL (ref 5–40)

## 2017-09-20 LAB — HEMOGLOBIN A1C
Est. average glucose Bld gHb Est-mCnc: 117 mg/dL
HEMOGLOBIN A1C: 5.7 % — AB (ref 4.8–5.6)

## 2017-11-15 DIAGNOSIS — R69 Illness, unspecified: Secondary | ICD-10-CM | POA: Diagnosis not present

## 2018-03-17 ENCOUNTER — Ambulatory Visit: Payer: Medicare HMO

## 2018-03-17 ENCOUNTER — Ambulatory Visit (INDEPENDENT_AMBULATORY_CARE_PROVIDER_SITE_OTHER): Payer: Medicare HMO | Admitting: Family Medicine

## 2018-03-17 VITALS — BP 143/88 | Ht 71.0 in | Wt 244.6 lb

## 2018-03-17 DIAGNOSIS — Z0000 Encounter for general adult medical examination without abnormal findings: Secondary | ICD-10-CM | POA: Diagnosis not present

## 2018-03-17 NOTE — Patient Instructions (Signed)
Thank you for taking time to come for your Medicare Wellness Visit. I appreciate your ongoing commitment to your health goals. Please review the following plan we discussed and let me know if I can assist you in the future.  Leroy Kennedy LPN    Healthy Eating Following a healthy eating pattern may help you to achieve and maintain a healthy body weight, reduce the risk of chronic disease, and live a long and productive life. It is important to follow a healthy eating pattern at an appropriate calorie level for your body. Your nutritional needs should be met primarily through food by choosing a variety of nutrient-rich foods. What are tips for following this plan? Reading food labels  Read labels and choose the following: ? Reduced or low sodium. ? Juices with 100% fruit juice. ? Foods with low saturated fats and high polyunsaturated and monounsaturated fats. ? Foods with whole grains, such as whole wheat, cracked wheat, brown rice, and wild rice. ? Whole grains that are fortified with folic acid. This is recommended for women who are pregnant or who want to become pregnant.  Read labels and avoid the following: ? Foods with a lot of added sugars. These include foods that contain brown sugar, corn sweetener, corn syrup, dextrose, fructose, glucose, high-fructose corn syrup, honey, invert sugar, lactose, malt syrup, maltose, molasses, raw sugar, sucrose, trehalose, or turbinado sugar.  Do not eat more than the following amounts of added sugar per day:  6 teaspoons (25 g) for women.  9 teaspoons (38 g) for men. ? Foods that contain processed or refined starches and grains. ? Refined grain products, such as white flour, degermed cornmeal, white bread, and white rice. Shopping  Choose nutrient-rich snacks, such as vegetables, whole fruits, and nuts. Avoid high-calorie and high-sugar snacks, such as potato chips, fruit snacks, and candy.  Use oil-based dressings and spreads on foods instead  of solid fats such as butter, stick margarine, or cream cheese.  Limit pre-made sauces, mixes, and "instant" products such as flavored rice, instant noodles, and ready-made pasta.  Try more plant-protein sources, such as tofu, tempeh, black beans, edamame, lentils, nuts, and seeds.  Explore eating plans such as the Mediterranean diet or vegetarian diet. Cooking  Use oil to saut or stir-fry foods instead of solid fats such as butter, stick margarine, or lard.  Try baking, boiling, grilling, or broiling instead of frying.  Remove the fatty part of meats before cooking.  Steam vegetables in water or broth. Meal planning   At meals, imagine dividing your plate into fourths: ? One-half of your plate is fruits and vegetables. ? One-fourth of your plate is whole grains. ? One-fourth of your plate is protein, especially lean meats, poultry, eggs, tofu, beans, or nuts.  Include low-fat dairy as part of your daily diet. Lifestyle  Choose healthy options in all settings, including home, work, school, restaurants, or stores.  Prepare your food safely: ? Wash your hands after handling raw meats. ? Keep food preparation surfaces clean by regularly washing with hot, soapy water. ? Keep raw meats separate from ready-to-eat foods, such as fruits and vegetables. ? Cook seafood, meat, poultry, and eggs to the recommended internal temperature. ? Store foods at safe temperatures. In general:  Keep cold foods at 3F (4.4C) or below.  Keep hot foods at 13F (60C) or above.  Keep your freezer at [facility] (-17.8C) or below.  Foods are no longer safe to eat when they have been between the temperatures of 40-13F (  4.4-60C) for more than 2 hours. What foods should I eat? Fruits Aim to eat 2 cup-equivalents of fresh, canned (in natural juice), or frozen fruits each day. Examples of 1 cup-equivalent of fruit include 1 small apple, 8 large strawberries, 1 cup canned fruit,  cup dried fruit, or 1  cup 100% juice. Vegetables Aim to eat 2-3 cup-equivalents of fresh and frozen vegetables each day, including different varieties and colors. Examples of 1 cup-equivalent of vegetables include 2 medium carrots, 2 cups raw, leafy greens, 1 cup chopped vegetable (raw or cooked), or 1 medium baked potato. Grains Aim to eat 6 ounce-equivalents of whole grains each day. Examples of 1 ounce-equivalent of grains include 1 slice of bread, 1 cup ready-to-eat cereal, 3 cups popcorn, or  cup cooked rice, pasta, or cereal. Meats and other proteins Aim to eat 5-6 ounce-equivalents of protein each day. Examples of 1 ounce-equivalent of protein include 1 egg, 1/2 cup nuts or seeds, or 1 tablespoon (16 g) peanut butter. A cut of meat or fish that is the size of a deck of cards is about 3-4 ounce-equivalents.  Of the protein you eat each week, try to have at least 8 ounces come from seafood. This includes salmon, trout, herring, and anchovies. Dairy Aim to eat 3 cup-equivalents of fat-free or low-fat dairy each day. Examples of 1 cup-equivalent of dairy include 1 cup (240 mL) milk, 8 ounces (250 g) yogurt, 1 ounces (44 g) natural cheese, or 1 cup (240 mL) fortified soy milk. Fats and oils  Aim for about 5 teaspoons (21 g) per day. Choose monounsaturated fats, such as canola and olive oils, avocados, peanut butter, and most nuts, or polyunsaturated fats, such as sunflower, corn, and soybean oils, walnuts, pine nuts, sesame seeds, sunflower seeds, and flaxseed. Beverages  Aim for six 8-oz glasses of water per day. Limit coffee to three to five 8-oz cups per day.  Limit caffeinated beverages that have added calories, such as soda and energy drinks.  Limit alcohol intake to no more than 1 drink a day for nonpregnant women and 2 drinks a day for men. One drink equals 12 oz of beer (355 mL), 5 oz of wine (148 mL), or 1 oz of hard liquor (44 mL). Seasoning and other foods  Avoid adding excess amounts of salt to  your foods. Try flavoring foods with herbs and spices instead of salt.  Avoid adding sugar to foods.  Try using oil-based dressings, sauces, and spreads instead of solid fats. This information is based on general [place] nutrition guidelines. For more information, visit [employer].es. Exact amounts may vary based on your nutrition needs. Summary  A healthy eating plan may help you to maintain a healthy weight, reduce the risk of chronic diseases, and stay active throughout your life.  Plan your meals. Make sure you eat the right portions of a variety of nutrient-rich foods.  Try baking, boiling, grilling, or broiling instead of frying.  Choose healthy options in all settings, including home, work, school, restaurants, or stores. This information is not intended to replace advice given to you by your health care provider. Make sure you discuss any questions you have with your health care provider. Document Released: 05/13/2017 Document Revised: 05/13/2017 Document Reviewed: 05/13/2017 Elsevier Interactive Patient Education  2019 [place] Directive  Advance directives are legal documents that let you make choices ahead of time about your health care and medical treatment in case you become unable to communicate for yourself. Advance directives are  you become unable to communicate for yourself. Advance directives are a way for you to communicate your wishes to family, friends, and health care providers. This can help convey your decisions about end-of-life care if you become unable to communicate. Discussing and writing advance directives should happen over time rather than all at once. Advance directives can be changed depending on your situation and what you want, even after you have signed the advance directives. If you do not have an advance directive, some states assign family decision makers to act on your behalf based on how closely you are related to them. Each state has its own laws regarding advance directives. You  may want to check with your health care provider, attorney, or state representative about the laws in your state. There are different types of advance directives, such as:  Medical power of attorney.  Living will.  Do not resuscitate (DNR) or do not attempt resuscitation (DNAR) order. Health care proxy and medical power of attorney A health care proxy, also called a health care agent, is a person who is appointed to make medical decisions for you in cases in which you are unable to make the decisions yourself. Generally, people choose someone they know well and trust to represent their preferences. Make sure to ask this person for an agreement to act as your proxy. A proxy may have to exercise judgment in the event of a medical decision for which your wishes are not known. A medical power of attorney is a legal document that names your health care proxy. Depending on the laws in your state, after the document is written, it may also need to be:  Signed.  Notarized.  Dated.  Copied.  Witnessed.  Incorporated into your medical record. You may also want to appoint someone to manage your financial affairs in a situation in which you are unable to do so. This is called a durable power of attorney for finances. It is a separate legal document from the durable power of attorney for health care. You may choose the same person or someone different from your health care proxy to act as your agent in financial matters. If you do not appoint a proxy, or if there is a concern that the proxy is not acting in your best interests, a court-appointed guardian may be designated to act on your behalf. Living will A living will is a set of instructions documenting your wishes about medical care when you cannot express them yourself. Health care providers should keep a copy of your living will in your medical record. You may want to give a copy to family members or friends. To alert caregivers in case of an  emergency, you can place a card in your wallet to let them know that you have a living will and where they can find it. A living will is used if you become:  Terminally ill.  Incapacitated.  Unable to communicate or make decisions. Items to consider in your living will include:  The use or non-use of life-sustaining equipment, such as dialysis machines and breathing machines (ventilators).  A DNR or DNAR order, which is the instruction not to use cardiopulmonary resuscitation (CPR) if breathing or heartbeat stops.  The use or non-use of tube feeding.  Withholding of food and fluids.  Comfort (palliative) care when the goal becomes comfort rather than a cure.  Organ and tissue donation. A living will does not give instructions for distributing your money and property if you should   of a lawyer when writing a will. Decisions about taxes, beneficiaries, and asset distribution will be legally binding. This process can relieve your family and friends of any concerns surrounding disputes or questions that may come up about the distribution of your assets. DNR or DNAR A DNR or DNAR order is a request not to have CPR in the event that your heart stops beating or you stop breathing. If a DNR or DNAR order has not been made and shared, a health care provider will try to help any patient whose heart has stopped or who has stopped breathing. If you plan to have surgery, talk with your health care provider about how your DNR or DNAR order will be followed if problems occur. Summary  Advance directives are the legal documents that allow you to make choices ahead of time about your health care and medical treatment in case you become unable to communicate for yourself.  The process of discussing and writing advance directives should happen over time. You can change the advance directives, even after you have signed them.  Advance directives include DNR or DNAR orders, living  wills, and designating an agent as your medical power of attorney. This information is not intended to replace advice given to you by your health care provider. Make sure you discuss any questions you have with your health care provider. Document Released: 05/08/2007 Document Revised: 12/19/2015 Document Reviewed: 12/19/2015 Elsevier Interactive Patient Education  2019 [employer].

## 2018-03-17 NOTE — Progress Notes (Addendum)
Presents today for [employer] Annual Wellness Visit   Patient Care Team: Forrest Moron, MD as PCP - General (Internal Medicine) Burden, Lincoln Brigham, MD as Referring Physician (Ophthalmology)     Immunization status:  Immunization History  Administered Date(s) Administered  . Influenza Split 02/12/2010, 12/26/2011  . Influenza,inj,Quad PF,6+ Mos 11/17/2014  . Influenza-Unspecified 11/30/2013, 01/13/2016, 11/13/2016  . Pneumococcal Conjugate-13 11/17/2014  . Pneumococcal Polysaccharide-23 02/21/2016, 11/13/2016  . Td 02/12/2010  . Zoster 02/12/2010     There are no preventive care reminders to display for this patient.   Functional Status Survey: Is the patient deaf or have difficulty hearing?: No Does the patient have difficulty seeing, even when wearing glasses/contacts?: No Does the patient have difficulty concentrating, remembering, or making decisions?: No Does the patient have difficulty walking or climbing stairs?: No Does the patient have difficulty dressing or bathing?: No Does the patient have difficulty doing errands alone such as visiting a doctor's office or shopping?: No   6CIT Screen 03/17/2018 03/15/2017  What Year? 0 points 0 points  What month? 0 points 0 points  What time? 0 points 0 points  Count back from 20 0 points 0 points  Months in reverse 0 points 0 points  Repeat phrase 0 points 0 points  Total Score 0 0        Clinical Support from 03/17/2018 in Primary Care at [facility]  AUDIT-C Score  2        Patient Active Problem List   Diagnosis Date Noted  . Gastroesophageal reflux disease without esophagitis 04/07/2017  . Pure hypercholesterolemia 04/07/2017  . Essential hypertension 11/17/2014  . Pre-diabetes 11/17/2014  . Post-menopausal 11/17/2014  . Allergic bronchitis 12/31/2011  . History of anemia 12/31/2011  . History of abnormal cervical Pap smear 12/31/2011  . Obesity (BMI 30.0-34.9) 12/31/2011  . Urinary urgency 12/31/2011      Past Medical History:  Diagnosis Date  . Blood transfusion without reported diagnosis 1998   due to fibroids  . Glaucoma    in left eye  . Hypertension   . Loss of bladder control      Past Surgical History:  Procedure Laterality Date  . ABDOMINAL HYSTERECTOMY  1998   One ovary intact; DUB; fibroids  . DILATION AND CURETTAGE OF UTERUS       Family History  Problem Relation Age of Onset  . Cancer Father 55       pancreatic cancer  . Multiple sclerosis Son   . Cancer Sister        breast cancer with mets     Social History   Socioeconomic History  . Marital status: Married    Spouse name: Not on file  . Number of children: 1  . Years of education: Not on file  . Highest education level: Bachelor's degree (e.g., BA, AB, BS)  Occupational History  . Not on file  Social Needs  . Financial resource strain: Not hard at all  . Food insecurity:    Worry: Never true    Inability: Never true  . Transportation needs:    Medical: No    Non-medical: No  Tobacco Use  . Smoking status: Never Smoker  . Smokeless tobacco: Never Used  Substance and Sexual Activity  . Alcohol use: Yes    Comment: 4 glasses of wine a month   . Drug use: No  . Sexual activity: Not on file  Lifestyle  . Physical activity:    Days per  week: 0 days    Minutes per session: 0 min  . Stress: Not at all  Relationships  . Social connections:    Talks on phone: More than three times a week    Gets together: More than three times a week    Attends religious service: More than 4 times per year    Active member of club or organization: Yes    Attends meetings of clubs or organizations: More than 4 times per year    Relationship status: Married  . Intimate partner violence:    Fear of current or ex partner: No    Emotionally abused: No    Physically abused: No    Forced sexual activity: No  Other Topics Concern  . Not on file  Social History Narrative   Marital status:  Married x 25  years; happily married ;second marriage.  Husband is 62 years old in 2018.      Children: one child (45); 2 grandchildren (65, 2); in [place].      Lives: with husband.      Employment: retired 25 years ago; retired from [employer]; IT trainer.      Tobacco: none; raised tobacco as a child.      Alcohol:  One glass per week    Education: college.       Exercise: Walking; sporadic.        ADLs: independent with ADLs; drives; no assistant devices.      Advanced Directives:  None; FULL CODE.  2018.     Allergies  Allergen Reactions  . Aleve [Naproxen Sodium]     LIGHTHEADNESS/can't function past 2 days  . Codeine Other (See Comments)    SEVERE HEADACHES  . Ibuprofen Other (See Comments)    UNABLE TO SLEEP  . Scallops [Shellfish Allergy]     Bad headaches  . Soy Allergy     Bad headaches     Prior to Admission medications   Medication Sig Start Date End Date Taking? Authorizing Provider  Ascorbic Acid (VITAMIN C) 1000 MG tablet Take 1,000 mg by mouth daily.   Yes [provider]  aspirin 81 MG tablet Take 81 mg by mouth daily.   Yes [provider]  cholecalciferol (VITAMIN D) 1000 UNITS tablet Take 1,000 Units by mouth daily.   Yes [provider]  cyanocobalamin 1000 MCG tablet Take 1,000 mcg by mouth daily.   Yes [provider]  famotidine (PEPCID) 20 MG tablet Take 20 mg by mouth 2 (two) times daily.   Yes [provider]  latanoprost (XALATAN) 0.005 % ophthalmic solution Place 1 drop into the left eye at bedtime.   Yes [provider]  losartan (COZAAR) 50 MG tablet Take 1 tablet (50 mg total) by mouth daily. 03/18/17  Yes Wardell Honour, MD  oxybutynin (DITROPAN) 5 MG tablet TAKE 1/2 TABLET AT BEDTIME, OR 1/2 TAB TWICE A DAY FOR BLADDER. 03/18/17  Yes Wardell Honour, MD  oxymetazoline (AFRIN) 0.05 % nasal spray Place 1 spray into both nostrils at bedtime. Use for 3 days only 05/06/17  Yes Tereasa Coop, PA-C   Pyridoxine HCl (VITAMIN B-6 PO) Take 10 mg by mouth daily.   Yes [provider]  loratadine (CLARITIN) 10 MG tablet Take 1 tablet (10 mg total) by mouth daily. Patient not taking: Reported on 09/19/2017 05/06/17   Tereasa Coop, PA-C  ondansetron (ZOFRAN) 4 MG tablet Take 1 tablet (4 mg total) by mouth every  8 (eight) hours as needed for nausea or vomiting. Patient not taking: Reported on 09/19/2017 05/06/17   Tereasa Coop, PA-C  ranitidine (ZANTAC) 75 MG tablet Take by mouth.    [provider]  oxybutynin (DITROPAN) 5 MG tablet TAKE 1/2 TABLET BY MOUTH AT BEDTIME OR 1/2 TABLET TWICE A DAY FOR BLADDER 08/10/11 12/26/11  Rise Mu, PA-C     Depression screen [facility] 2/9 03/17/2018 09/19/2017 05/06/2017 03/18/2017 03/15/2017  Decreased Interest 0 0 0 0 0  Down, Depressed, Hopeless 0 0 0 0 0  PHQ - 2 Score 0 0 0 0 0     Fall Risk  03/17/2018 09/19/2017 05/06/2017 03/18/2017 03/15/2017  Falls in the past year? - No No No No  Number falls in past yr: 0 - - - -  Injury with Fall? 0 - - - -  Follow up Falls evaluation completed;Falls prevention discussed - - - -      PHYSICAL EXAM: BP (!) 143/88   Ht 5\' 11"  (1.803 m)   Wt 244 lb 9.6 oz (110.9 kg)   BMI 34.11 kg/m    Wt Readings from Last 3 Encounters:  03/17/18 244 lb 9.6 oz (110.9 kg)  09/19/17 240 lb 3.2 oz (109 kg)  05/06/17 242 lb 12.8 oz (110.1 kg)     No exam data present    Physical Exam   Education/Counseling provided regarding diet and exercise, prevention of chronic diseases, smoking/tobacco cessation, if applicable, and reviewed "Covered Medicare Preventive Services."   ASSESSMENT/PLAN: There are no diagnoses linked to this encounter.      How often do you need to have someone help you when you read instructions, pamphlets, or other written materials from your doctor or pharmacy?: 1 - Never What is the last grade level you completed in school?: Bachelors Degree   Nutritional Status: BMI > 30   Obese Nutritional Risks: None Diabetes: No   Lives in Two level home no problem climbing stairs.  Home free of scattered rugs/well lit/no hand rails.  Colonoscopy discussed will schedule next year.   Patient will schedule mammogram . Eye exam scheduled 03-29-2017    I have reviewed and agree with the above AWV documentation. [place]. Reubin Milan, MD

## 2018-03-24 ENCOUNTER — Other Ambulatory Visit: Payer: Self-pay | Admitting: Family Medicine

## 2018-03-24 ENCOUNTER — Other Ambulatory Visit: Payer: Self-pay

## 2018-03-24 DIAGNOSIS — I10 Essential (primary) hypertension: Secondary | ICD-10-CM

## 2018-03-24 MED ORDER — OXYBUTYNIN CHLORIDE 5 MG PO TABS: ORAL_TABLET | ORAL | 3 refills | Status: DC

## 2018-03-24 MED ORDER — LOSARTAN POTASSIUM 50 MG PO TABS: 50.0000 mg | ORAL_TABLET | Freq: Every day | ORAL | 1 refills | Status: DC

## 2018-03-24 NOTE — Telephone Encounter (Signed)
Copied from [place] [phone]. Topic: Quick Communication - Rx Refill/Question >> Mar 24, 2018  9:00 AM Parke Poisson wrote: Medication: losartan (COZAAR) 50 MG tablet 2)oxybutynin (DITROPAN) 5 MG tablet  Has the patient contacted their pharmacy? No. Pt was just seen for wellness visit (Agent: If no, request that the patient contact the pharmacy for the refill.) (Agent: If yes, when and what did the pharmacy advise?)  Preferred Pharmacy (with phone number or street name): [id number] - [place], [place] AT [address] [phone] ([phone] (Fax)    Agent: Please be advised that RX refills may take up to 3 business days. We ask that you follow-up with your pharmacy.

## 2018-03-25 DIAGNOSIS — H401221 Low-tension glaucoma, left eye, mild stage: Secondary | ICD-10-CM | POA: Diagnosis not present

## 2018-03-25 DIAGNOSIS — H52209 Unspecified astigmatism, unspecified eye: Secondary | ICD-10-CM | POA: Diagnosis not present

## 2018-03-25 DIAGNOSIS — H5213 Myopia, bilateral: Secondary | ICD-10-CM | POA: Diagnosis not present

## 2018-03-25 DIAGNOSIS — H2513 Age-related nuclear cataract, bilateral: Secondary | ICD-10-CM | POA: Diagnosis not present

## 2018-04-02 ENCOUNTER — Encounter: Payer: Self-pay | Admitting: Internal Medicine

## 2018-05-16 ENCOUNTER — Other Ambulatory Visit: Payer: Self-pay | Admitting: *Deleted

## 2018-05-16 DIAGNOSIS — I10 Essential (primary) hypertension: Secondary | ICD-10-CM

## 2018-05-20 ENCOUNTER — Ambulatory Visit (INDEPENDENT_AMBULATORY_CARE_PROVIDER_SITE_OTHER): Payer: Medicare HMO | Admitting: Family Medicine

## 2018-05-20 ENCOUNTER — Other Ambulatory Visit: Payer: Self-pay

## 2018-05-20 DIAGNOSIS — E7800 Pure hypercholesterolemia, unspecified: Secondary | ICD-10-CM | POA: Diagnosis not present

## 2018-05-20 DIAGNOSIS — R7303 Prediabetes: Secondary | ICD-10-CM | POA: Diagnosis not present

## 2018-05-20 DIAGNOSIS — I10 Essential (primary) hypertension: Secondary | ICD-10-CM

## 2018-05-20 LAB — COMPREHENSIVE METABOLIC PANEL
ALT: 23 IU/L (ref 0–32)
AST: 17 IU/L (ref 0–40)
Albumin/Globulin Ratio: 1.6 (ref 1.2–2.2)
Albumin: 3.9 g/dL (ref 3.8–4.8)
Alkaline Phosphatase: 65 IU/L (ref 39–117)
BUN/Creatinine Ratio: 16 (ref 12–28)
BUN: 11 mg/dL (ref 8–27)
Bilirubin Total: 0.4 mg/dL (ref 0.0–1.2)
CO2: 23 mmol/L (ref 20–29)
Calcium: 9.6 mg/dL (ref 8.7–10.3)
Chloride: 107 mmol/L — ABNORMAL HIGH (ref 96–106)
Creatinine, Ser: 0.7 mg/dL (ref 0.57–1.00)
GFR calc Af Amer: 103 mL/min/{1.73_m2} (ref >59)
GFR calc non Af Amer: 89 mL/min/{1.73_m2} (ref >59)
Globulin, Total: 2.5 g/dL (ref 1.5–4.5)
Glucose: 96 mg/dL (ref 65–99)
Potassium: 4.4 mmol/L (ref 3.5–5.2)
Sodium: 142 mmol/L (ref 134–144)
Total Protein: 6.4 g/dL (ref 6.0–8.5)

## 2018-05-20 LAB — LIPID PANEL
Chol/HDL Ratio: 4.2 ratio (ref 0.0–4.4)
Cholesterol, Total: 186 mg/dL (ref 100–199)
HDL: 44 mg/dL (ref >39)
LDL Calculated: 114 mg/dL — ABNORMAL HIGH (ref 0–99)
Triglycerides: 139 mg/dL (ref 0–149)
VLDL Cholesterol Cal: 28 mg/dL (ref 5–40)

## 2018-05-23 ENCOUNTER — Encounter: Payer: Self-pay | Admitting: Family Medicine

## 2018-05-23 ENCOUNTER — Telehealth (INDEPENDENT_AMBULATORY_CARE_PROVIDER_SITE_OTHER): Payer: Medicare HMO | Admitting: Family Medicine

## 2018-05-23 ENCOUNTER — Other Ambulatory Visit: Payer: Self-pay

## 2018-05-23 VITALS — BP 150/93 | HR 62 | Wt 239.0 lb

## 2018-05-23 DIAGNOSIS — Z5181 Encounter for therapeutic drug level monitoring: Secondary | ICD-10-CM | POA: Diagnosis not present

## 2018-05-23 DIAGNOSIS — E669 Obesity, unspecified: Secondary | ICD-10-CM | POA: Diagnosis not present

## 2018-05-23 DIAGNOSIS — E7800 Pure hypercholesterolemia, unspecified: Secondary | ICD-10-CM | POA: Diagnosis not present

## 2018-05-23 DIAGNOSIS — I10 Essential (primary) hypertension: Secondary | ICD-10-CM | POA: Diagnosis not present

## 2018-05-23 MED ORDER — LOSARTAN POTASSIUM 100 MG PO TABS: 100.0000 mg | ORAL_TABLET | Freq: Every day | ORAL | 1 refills | Status: DC

## 2018-05-23 MED ORDER — LATANOPROST 0.005 % OP SOLN: 1.0000 [drp] | Freq: Every day | OPHTHALMIC | 11 refills | Status: AC

## 2018-05-23 MED ORDER — OXYBUTYNIN CHLORIDE 5 MG PO TABS: ORAL_TABLET | ORAL | 3 refills | Status: DC

## 2018-05-23 NOTE — Progress Notes (Signed)
Telemedicine Encounter- SOAP NOTE Established Patient  This telephone encounter was conducted with the patient's (or proxy's) verbal consent via audio telecommunications: yes/no: Yes Patient was instructed to have this encounter in a suitably private space; and to only have persons present to whom they give permission to participate. In addition, patient identity was confirmed by use of name plus two identifiers (DOB and address).  I discussed the limitations, risks, security and privacy concerns of performing an evaluation and management service by telephone and the availability of in person appointments. I also discussed with the patient that there may be a patient responsible charge related to this service. The patient expressed understanding and agreed to proceed.  I spent a total of TIME; 0 MIN TO 60 MIN: 20 minutes talking with the patient or their proxy.  No chief complaint on file.   Subjective   Lisa Blair is a 68 y.o. established patient. Telephone visit today for  HPI  Hypertension: Patient here for follow-up of elevated blood pressure. She is exercising and is adherent to low salt diet.  Blood pressure is not well controlled at home. Cardiac symptoms none. Patient denies chest pain, chest pressure/discomfort, dyspnea, exertional chest pressure/discomfort and irregular heart beat.  Cardiovascular risk factors: dyslipidemia and hypertension. Use of agents associated with hypertension: none. History of target organ damage: none. She reports that her bp is better after exercise such as mowing the grass She gets readings between 130s-160s  Obesity Wt Readings from Last 3 Encounters:  05/23/18 239 lb (108.4 kg)  03/17/18 244 lb 9.6 oz (110.9 kg)  09/19/17 240 lb 3.2 oz (109 kg)   She reports that her weight is the same and is very consistent She exercises once a week She state that a few times she has walked around the block and it is over 2000 steps to go around the block   Hyperlipidemia Lab Results  Component Value Date   CHOL 186 05/20/2018   CHOL 168 09/19/2017   CHOL 175 03/18/2017   Lab Results  Component Value Date   HDL 44 05/20/2018   HDL 43 09/19/2017   HDL 42 03/18/2017   Lab Results  Component Value Date   LDLCALC 114 (H) 05/20/2018   LDLCALC 102 (H) 09/19/2017   LDLCALC 106 (H) 03/18/2017   Lab Results  Component Value Date   TRIG 139 05/20/2018   TRIG 113 09/19/2017   TRIG 134 03/18/2017   Lab Results  Component Value Date   CHOLHDL 4.2 05/20/2018   CHOLHDL 3.9 09/19/2017   CHOLHDL 4.2 03/18/2017   No results found for: LDLDIRECT  She denies chest pains, shortness of breath, or calf pain She is exercising with some walking and mowing the lawn She does not smoke She eats a heart healthy diet   Patient Active Problem List   Diagnosis Date Noted  . Gastroesophageal reflux disease without esophagitis 04/07/2017  . Pure hypercholesterolemia 04/07/2017  . Essential hypertension 11/17/2014  . Pre-diabetes 11/17/2014  . Post-menopausal 11/17/2014  . Allergic bronchitis 12/31/2011  . History of anemia 12/31/2011  . History of abnormal cervical Pap smear 12/31/2011  . Obesity (BMI 30.0-34.9) 12/31/2011  . Urinary urgency 12/31/2011    Past Medical History:  Diagnosis Date  . Blood transfusion without reported diagnosis 1998   due to fibroids  . Glaucoma    in left eye  . Hypertension   . Loss of bladder control     Current Outpatient Medications  Medication Sig Dispense  Refill  . Ascorbic Acid (VITAMIN C) 1000 MG tablet Take 1,000 mg by mouth daily.    Marland Kitchen aspirin 81 MG tablet Take 81 mg by mouth daily.    . cholecalciferol (VITAMIN D) 1000 UNITS tablet Take 1,000 Units by mouth daily.    . cyanocobalamin 1000 MCG tablet Take 1,000 mcg by mouth daily.    . famotidine (PEPCID) 20 MG tablet Take 20 mg by mouth 2 (two) times daily.    Marland Kitchen latanoprost (XALATAN) 0.005 % ophthalmic solution Place 1 drop into the left  eye at bedtime. 2.5 mL 11  . loratadine (CLARITIN) 10 MG tablet Take 1 tablet (10 mg total) by mouth daily. 30 tablet 11  . losartan (COZAAR) 100 MG tablet Take 1 tablet (100 mg total) by mouth daily. 90 tablet 1  . ondansetron (ZOFRAN) 4 MG tablet Take 1 tablet (4 mg total) by mouth every 8 (eight) hours as needed for nausea or vomiting. 12 tablet 0  . oxybutynin (DITROPAN) 5 MG tablet TAKE 1/2 TABLET AT BEDTIME, OR 1/2 TAB TWICE A DAY FOR BLADDER. 90 tablet 3  . oxymetazoline (AFRIN) 0.05 % nasal spray Place 1 spray into both nostrils at bedtime. Use for 3 days only 30 mL 0  . Pyridoxine HCl (VITAMIN B-6 PO) Take 10 mg by mouth daily.    . ranitidine (ZANTAC) 75 MG tablet Take by mouth.     No current facility-administered medications for this visit.     Allergies  Allergen Reactions  . Aleve [Naproxen Sodium]     LIGHTHEADNESS/can't function past 2 days  . Codeine Other (See Comments)    SEVERE HEADACHES  . Ibuprofen Other (See Comments)    UNABLE TO SLEEP  . Scallops [Shellfish Allergy]     Bad headaches  . Soy Allergy     Bad headaches    Social History   Socioeconomic History  . Marital status: Married    Spouse name: Not on file  . Number of children: 1  . Years of education: Not on file  . Highest education level: Bachelor's degree (e.g., BA, AB, BS)  Occupational History  . Not on file  Social Needs  . Financial resource strain: Not hard at all  . Food insecurity:    Worry: Never true    Inability: Never true  . Transportation needs:    Medical: No    Non-medical: No  Tobacco Use  . Smoking status: Never Smoker  . Smokeless tobacco: Never Used  Substance and Sexual Activity  . Alcohol use: Yes    Comment: 4 glasses of wine a month   . Drug use: No  . Sexual activity: Not on file  Lifestyle  . Physical activity:    Days per week: 0 days    Minutes per session: 0 min  . Stress: Not at all  Relationships  . Social connections:    Talks on phone: More  than three times a week    Gets together: More than three times a week    Attends religious service: More than 4 times per year    Active member of club or organization: Yes    Attends meetings of clubs or organizations: More than 4 times per year    Relationship status: Married  . Intimate partner violence:    Fear of current or ex partner: No    Emotionally abused: No    Physically abused: No    Forced sexual activity: No  Other Topics Concern  .  Not on file  Social History Narrative   Marital status:  Married x 25 years; happily married ;second marriage.  Husband is 25 years old in 2018.      Children: one child (51); 2 grandchildren (73, 2); in [place].      Lives: with husband.      Employment: retired 25 years ago; retired from [employer]; [employer] trainer.      Tobacco: none; raised tobacco as a child.      Alcohol:  One glass per week    Education: college.       Exercise: Walking; sporadic.        ADLs: independent with ADLs; drives; no assistant devices.      Advanced Directives:  None; FULL CODE.  2018.    ROS Review of Systems  Constitutional: Negative for activity change, appetite change, chills and fever.  HENT: Negative for congestion, nosebleeds, trouble swallowing and voice change.   Respiratory: Negative for cough, shortness of breath and wheezing.   Gastrointestinal: Negative for diarrhea, nausea and vomiting.  Genitourinary: Negative for difficulty urinating, dysuria, flank pain and hematuria.  Musculoskeletal: Negative for back pain, joint swelling and neck pain.  Neurological: Negative for dizziness, speech difficulty, light-headedness and numbness.  See HPI. All other review of systems negative.   Objective   Vitals as reported by the patient: Today's Vitals   05/23/18 0811  BP: (!) 150/93  Pulse: 62  Weight: 239 lb (108.4 kg)    Diagnoses and all orders for this visit:  Essential hypertension- increased losartan from 50mg  to 100mg  with plan to  continue bp follow up and six months office visit -     losartan (COZAAR) 100 MG tablet; Take 1 tablet (100 mg total) by mouth daily.  Pure hypercholesterolemia-  Reviewed goal for lipids, cpm, add fiber  Encounter for medication monitoring - increased losartan to 100mg   Obesity (BMI 30.0-34.9)- increase exercise  Other orders -     oxybutynin (DITROPAN) 5 MG tablet; TAKE 1/2 TABLET AT BEDTIME, OR 1/2 TAB TWICE A DAY FOR BLADDER. -     latanoprost (XALATAN) 0.005 % ophthalmic solution; Place 1 drop into the left eye at bedtime.     I discussed the assessment and treatment plan with the patient. The patient was provided an opportunity to ask questions and all were answered. The patient agreed with the plan and demonstrated an understanding of the instructions.   The patient was advised to call back or seek an in-person evaluation if the symptoms worsen or if the condition fails to improve as anticipated.  I provided 20 minutes of non-face-to-face time during this encounter.  Forrest Moron, MD  Primary Care at [facility]

## 2018-05-23 NOTE — Progress Notes (Signed)
Pt c/o follow up on blood work and would like to discuss BP.

## 2018-09-09 DIAGNOSIS — H401121 Primary open-angle glaucoma, left eye, mild stage: Secondary | ICD-10-CM | POA: Diagnosis not present

## 2018-09-09 DIAGNOSIS — H5213 Myopia, bilateral: Secondary | ICD-10-CM | POA: Diagnosis not present

## 2018-09-09 DIAGNOSIS — H269 Unspecified cataract: Secondary | ICD-10-CM | POA: Diagnosis not present

## 2018-09-09 DIAGNOSIS — H3589 Other specified retinal disorders: Secondary | ICD-10-CM | POA: Diagnosis not present

## 2018-09-09 DIAGNOSIS — H2513 Age-related nuclear cataract, bilateral: Secondary | ICD-10-CM | POA: Diagnosis not present

## 2018-10-13 DIAGNOSIS — R69 Illness, unspecified: Secondary | ICD-10-CM | POA: Diagnosis not present

## 2018-10-17 DIAGNOSIS — R69 Illness, unspecified: Secondary | ICD-10-CM | POA: Diagnosis not present

## 2018-10-24 ENCOUNTER — Encounter: Payer: Self-pay | Admitting: Family Medicine

## 2018-10-24 DIAGNOSIS — Z803 Family history of malignant neoplasm of breast: Secondary | ICD-10-CM | POA: Diagnosis not present

## 2018-10-24 DIAGNOSIS — Z1231 Encounter for screening mammogram for malignant neoplasm of breast: Secondary | ICD-10-CM | POA: Diagnosis not present

## 2018-10-30 ENCOUNTER — Encounter: Payer: Self-pay | Admitting: Family Medicine

## 2018-10-30 DIAGNOSIS — R922 Inconclusive mammogram: Secondary | ICD-10-CM | POA: Diagnosis not present

## 2018-12-03 ENCOUNTER — Other Ambulatory Visit: Payer: Self-pay | Admitting: Family Medicine

## 2018-12-03 DIAGNOSIS — I10 Essential (primary) hypertension: Secondary | ICD-10-CM

## 2018-12-03 NOTE — Telephone Encounter (Signed)
Requested medication (s) are due for refill today: yes  Requested medication (s) are on the active medication list: yes  Last refill:  08/27/2018  Future visit scheduled: no  Notes to clinic:  Review for refill   Requested Prescriptions  Pending Prescriptions Disp Refills   losartan (COZAAR) 100 MG tablet [Pharmacy Med Name: LOSARTAN POTASSIUM 100 MG TAB] 90 tablet 1    Sig: TAKE 1 TABLET BY MOUTH EVERY DAY     Cardiovascular:  Angiotensin Receptor Blockers Failed - 12/03/2018  1:18 AM      Failed - Cr in normal range and within 180 days    Creat  Date Value Ref Range Status  07/08/2014 0.55 0.50 - 1.10 mg/dL Final   Creatinine, Ser  Date Value Ref Range Status  05/20/2018 0.70 0.57 - 1.00 mg/dL Final         Failed - K in normal range and within 180 days    Potassium  Date Value Ref Range Status  05/20/2018 4.4 3.5 - 5.2 mmol/L Final         Failed - Last BP in normal range    BP Readings from Last 1 Encounters:  05/23/18 (!) 150/93         Failed - Valid encounter within last 6 months    Recent Outpatient Visits          6 months ago Essential hypertension   [facility], MD   6 months ago Essential hypertension   [facility] at [facility], Arlie Solomons, MD   8 months ago Medicare annual wellness visit, subsequent   [facility], Lilia Argue, MD   1 year ago Essential hypertension   [facility], Arlie Solomons, MD   1 year ago Nasal congestion   [facility] at [facility], Audrie Lia, PA-C             Passed - Patient is not pregnant

## 2019-03-12 DIAGNOSIS — H401121 Primary open-angle glaucoma, left eye, mild stage: Secondary | ICD-10-CM | POA: Diagnosis not present

## 2019-03-12 DIAGNOSIS — H47292 Other optic atrophy, left eye: Secondary | ICD-10-CM | POA: Diagnosis not present

## 2019-03-12 DIAGNOSIS — H25813 Combined forms of age-related cataract, bilateral: Secondary | ICD-10-CM | POA: Diagnosis not present

## 2019-03-12 DIAGNOSIS — H52209 Unspecified astigmatism, unspecified eye: Secondary | ICD-10-CM | POA: Diagnosis not present

## 2019-03-12 DIAGNOSIS — H5210 Myopia, unspecified eye: Secondary | ICD-10-CM | POA: Diagnosis not present

## 2019-03-12 DIAGNOSIS — H25013 Cortical age-related cataract, bilateral: Secondary | ICD-10-CM | POA: Diagnosis not present

## 2019-05-09 ENCOUNTER — Other Ambulatory Visit: Payer: Self-pay | Admitting: Family Medicine

## 2019-05-09 NOTE — Telephone Encounter (Signed)
Requested Prescriptions  Pending Prescriptions Disp Refills  . oxybutynin (DITROPAN) 5 MG tablet [Pharmacy Med Name: OXYBUTYNIN 5 MG TABLET] 90 tablet 3    Sig: TAKE 1/2 TABLET AT BEDTIME, OR 1/2 TABLET TWICE A DAY FOR BLADDER.     Urology:  Bladder Agents Passed - 05/09/2019 10:16 AM      Passed - Valid encounter within last 12 months    Recent Outpatient Visits          11 months ago Essential hypertension   [facility] [place], MD   11 months ago Essential hypertension   [facility] at [facility], Arlie Solomons, MD   1 year ago Medicare annual wellness visit, subsequent   [facility], Lilia Argue, MD   1 year ago Essential hypertension   [facility], MD   2 years ago Nasal congestion   [facility] [place], PA-C

## 2019-05-11 ENCOUNTER — Telehealth: Payer: Self-pay | Admitting: *Deleted

## 2019-05-11 NOTE — Telephone Encounter (Signed)
Schedule AWV  Patient is also due to schedule yearly follow up

## 2019-06-16 ENCOUNTER — Other Ambulatory Visit: Payer: Self-pay | Admitting: Family Medicine

## 2019-06-16 DIAGNOSIS — I10 Essential (primary) hypertension: Secondary | ICD-10-CM

## 2019-06-16 NOTE — Telephone Encounter (Signed)
Requested medications are due for refill today?  Yes  Requested medications are on active medication list?  Yes  Last Refill:   12/05/2018   # 90 with 1 refill    Future visit scheduled?   No   Notes to Clinic:  Medication failed RX refill protocol due to no valid encounter in the past 6 months and no lab work in the past 180  days.  Last office visit was on 05/23/2018.

## 2019-09-22 DIAGNOSIS — L820 Inflamed seborrheic keratosis: Secondary | ICD-10-CM | POA: Diagnosis not present

## 2019-09-22 DIAGNOSIS — L578 Other skin changes due to chronic exposure to nonionizing radiation: Secondary | ICD-10-CM | POA: Diagnosis not present

## 2019-09-22 DIAGNOSIS — D225 Melanocytic nevi of trunk: Secondary | ICD-10-CM | POA: Diagnosis not present

## 2019-09-22 DIAGNOSIS — D1801 Hemangioma of skin and subcutaneous tissue: Secondary | ICD-10-CM | POA: Diagnosis not present

## 2019-09-22 DIAGNOSIS — L814 Other melanin hyperpigmentation: Secondary | ICD-10-CM | POA: Diagnosis not present

## 2019-09-22 DIAGNOSIS — L821 Other seborrheic keratosis: Secondary | ICD-10-CM | POA: Diagnosis not present

## 2019-09-22 DIAGNOSIS — D2261 Melanocytic nevi of right upper limb, including shoulder: Secondary | ICD-10-CM | POA: Diagnosis not present

## 2019-09-26 DIAGNOSIS — I10 Essential (primary) hypertension: Secondary | ICD-10-CM | POA: Diagnosis not present

## 2019-09-26 DIAGNOSIS — Z6832 Body mass index (BMI) 32.0-32.9, adult: Secondary | ICD-10-CM | POA: Diagnosis not present

## 2019-09-26 DIAGNOSIS — K219 Gastro-esophageal reflux disease without esophagitis: Secondary | ICD-10-CM | POA: Diagnosis not present

## 2019-09-26 DIAGNOSIS — Z7982 Long term (current) use of aspirin: Secondary | ICD-10-CM | POA: Diagnosis not present

## 2019-09-26 DIAGNOSIS — E669 Obesity, unspecified: Secondary | ICD-10-CM | POA: Diagnosis not present

## 2019-09-26 DIAGNOSIS — R32 Unspecified urinary incontinence: Secondary | ICD-10-CM | POA: Diagnosis not present

## 2019-09-26 DIAGNOSIS — Z803 Family history of malignant neoplasm of breast: Secondary | ICD-10-CM | POA: Diagnosis not present

## 2019-09-26 DIAGNOSIS — H409 Unspecified glaucoma: Secondary | ICD-10-CM | POA: Diagnosis not present

## 2019-09-26 DIAGNOSIS — N3281 Overactive bladder: Secondary | ICD-10-CM | POA: Diagnosis not present

## 2019-10-26 DIAGNOSIS — R69 Illness, unspecified: Secondary | ICD-10-CM | POA: Diagnosis not present

## 2019-12-09 DIAGNOSIS — R69 Illness, unspecified: Secondary | ICD-10-CM | POA: Diagnosis not present

## 2020-01-22 DIAGNOSIS — Z79899 Other long term (current) drug therapy: Secondary | ICD-10-CM | POA: Diagnosis not present

## 2020-01-22 DIAGNOSIS — R69 Illness, unspecified: Secondary | ICD-10-CM | POA: Diagnosis not present

## 2020-01-22 DIAGNOSIS — N3281 Overactive bladder: Secondary | ICD-10-CM | POA: Diagnosis not present

## 2020-01-22 DIAGNOSIS — I10 Essential (primary) hypertension: Secondary | ICD-10-CM | POA: Diagnosis not present

## 2020-04-05 DIAGNOSIS — H2513 Age-related nuclear cataract, bilateral: Secondary | ICD-10-CM | POA: Diagnosis not present

## 2020-04-05 DIAGNOSIS — H52203 Unspecified astigmatism, bilateral: Secondary | ICD-10-CM | POA: Diagnosis not present

## 2020-04-05 DIAGNOSIS — H524 Presbyopia: Secondary | ICD-10-CM | POA: Diagnosis not present

## 2020-04-05 DIAGNOSIS — H5213 Myopia, bilateral: Secondary | ICD-10-CM | POA: Diagnosis not present

## 2020-04-05 DIAGNOSIS — H401121 Primary open-angle glaucoma, left eye, mild stage: Secondary | ICD-10-CM | POA: Diagnosis not present

## 2020-05-02 DIAGNOSIS — K1370 Unspecified lesions of oral mucosa: Secondary | ICD-10-CM | POA: Diagnosis not present

## 2020-05-11 DIAGNOSIS — I10 Essential (primary) hypertension: Secondary | ICD-10-CM | POA: Diagnosis not present

## 2020-05-11 DIAGNOSIS — E669 Obesity, unspecified: Secondary | ICD-10-CM | POA: Diagnosis not present

## 2020-05-11 DIAGNOSIS — Z6832 Body mass index (BMI) 32.0-32.9, adult: Secondary | ICD-10-CM | POA: Diagnosis not present

## 2020-05-11 DIAGNOSIS — H409 Unspecified glaucoma: Secondary | ICD-10-CM | POA: Diagnosis not present

## 2020-05-11 DIAGNOSIS — R32 Unspecified urinary incontinence: Secondary | ICD-10-CM | POA: Diagnosis not present

## 2020-05-11 DIAGNOSIS — N3281 Overactive bladder: Secondary | ICD-10-CM | POA: Diagnosis not present

## 2020-05-11 DIAGNOSIS — Z7982 Long term (current) use of aspirin: Secondary | ICD-10-CM | POA: Diagnosis not present

## 2020-05-11 DIAGNOSIS — K219 Gastro-esophageal reflux disease without esophagitis: Secondary | ICD-10-CM | POA: Diagnosis not present

## 2020-05-11 DIAGNOSIS — Z803 Family history of malignant neoplasm of breast: Secondary | ICD-10-CM | POA: Diagnosis not present

## 2020-05-11 DIAGNOSIS — Z008 Encounter for other general examination: Secondary | ICD-10-CM | POA: Diagnosis not present

## 2020-06-01 DIAGNOSIS — D102 Benign neoplasm of floor of mouth: Secondary | ICD-10-CM | POA: Diagnosis not present

## 2020-06-01 DIAGNOSIS — D101 Benign neoplasm of tongue: Secondary | ICD-10-CM | POA: Diagnosis not present

## 2020-06-01 DIAGNOSIS — D1039 Benign neoplasm of other parts of mouth: Secondary | ICD-10-CM | POA: Diagnosis not present

## 2020-06-08 DIAGNOSIS — H524 Presbyopia: Secondary | ICD-10-CM | POA: Diagnosis not present

## 2020-06-08 DIAGNOSIS — H269 Unspecified cataract: Secondary | ICD-10-CM | POA: Diagnosis not present

## 2020-06-08 DIAGNOSIS — H401121 Primary open-angle glaucoma, left eye, mild stage: Secondary | ICD-10-CM | POA: Diagnosis not present

## 2020-06-08 DIAGNOSIS — Z1231 Encounter for screening mammogram for malignant neoplasm of breast: Secondary | ICD-10-CM | POA: Diagnosis not present

## 2020-06-08 DIAGNOSIS — H2513 Age-related nuclear cataract, bilateral: Secondary | ICD-10-CM | POA: Diagnosis not present

## 2020-06-08 DIAGNOSIS — H5213 Myopia, bilateral: Secondary | ICD-10-CM | POA: Diagnosis not present

## 2020-06-08 DIAGNOSIS — H52203 Unspecified astigmatism, bilateral: Secondary | ICD-10-CM | POA: Diagnosis not present

## 2021-02-03 DIAGNOSIS — H52203 Unspecified astigmatism, bilateral: Secondary | ICD-10-CM | POA: Diagnosis not present

## 2021-02-03 DIAGNOSIS — H5213 Myopia, bilateral: Secondary | ICD-10-CM | POA: Diagnosis not present

## 2021-02-03 DIAGNOSIS — H401131 Primary open-angle glaucoma, bilateral, mild stage: Secondary | ICD-10-CM | POA: Diagnosis not present

## 2021-02-03 DIAGNOSIS — H25813 Combined forms of age-related cataract, bilateral: Secondary | ICD-10-CM | POA: Diagnosis not present

## 2021-02-03 DIAGNOSIS — H47092 Other disorders of optic nerve, not elsewhere classified, left eye: Secondary | ICD-10-CM | POA: Diagnosis not present

## 2021-02-03 DIAGNOSIS — H524 Presbyopia: Secondary | ICD-10-CM | POA: Diagnosis not present

## 2021-03-02 DIAGNOSIS — H409 Unspecified glaucoma: Secondary | ICD-10-CM | POA: Diagnosis not present

## 2021-03-02 DIAGNOSIS — R3915 Urgency of urination: Secondary | ICD-10-CM | POA: Diagnosis not present

## 2021-03-02 DIAGNOSIS — Z0000 Encounter for general adult medical examination without abnormal findings: Secondary | ICD-10-CM | POA: Diagnosis not present

## 2021-03-02 DIAGNOSIS — E669 Obesity, unspecified: Secondary | ICD-10-CM | POA: Diagnosis not present

## 2021-03-02 DIAGNOSIS — R7303 Prediabetes: Secondary | ICD-10-CM | POA: Diagnosis not present

## 2021-03-02 DIAGNOSIS — E7800 Pure hypercholesterolemia, unspecified: Secondary | ICD-10-CM | POA: Diagnosis not present

## 2021-03-02 DIAGNOSIS — J45909 Unspecified asthma, uncomplicated: Secondary | ICD-10-CM | POA: Diagnosis not present

## 2021-03-02 DIAGNOSIS — K219 Gastro-esophageal reflux disease without esophagitis: Secondary | ICD-10-CM | POA: Diagnosis not present

## 2021-03-02 DIAGNOSIS — I10 Essential (primary) hypertension: Secondary | ICD-10-CM | POA: Diagnosis not present

## 2021-04-28 DIAGNOSIS — R32 Unspecified urinary incontinence: Secondary | ICD-10-CM | POA: Diagnosis not present

## 2021-04-28 DIAGNOSIS — Z803 Family history of malignant neoplasm of breast: Secondary | ICD-10-CM | POA: Diagnosis not present

## 2021-04-28 DIAGNOSIS — Z008 Encounter for other general examination: Secondary | ICD-10-CM | POA: Diagnosis not present

## 2021-04-28 DIAGNOSIS — Z823 Family history of stroke: Secondary | ICD-10-CM | POA: Diagnosis not present

## 2021-04-28 DIAGNOSIS — Z6833 Body mass index (BMI) 33.0-33.9, adult: Secondary | ICD-10-CM | POA: Diagnosis not present

## 2021-04-28 DIAGNOSIS — H409 Unspecified glaucoma: Secondary | ICD-10-CM | POA: Diagnosis not present

## 2021-04-28 DIAGNOSIS — E669 Obesity, unspecified: Secondary | ICD-10-CM | POA: Diagnosis not present

## 2021-04-28 DIAGNOSIS — I10 Essential (primary) hypertension: Secondary | ICD-10-CM | POA: Diagnosis not present

## 2021-04-28 DIAGNOSIS — Z886 Allergy status to analgesic agent status: Secondary | ICD-10-CM | POA: Diagnosis not present

## 2021-04-28 DIAGNOSIS — Z91013 Allergy to seafood: Secondary | ICD-10-CM | POA: Diagnosis not present

## 2021-04-28 DIAGNOSIS — K219 Gastro-esophageal reflux disease without esophagitis: Secondary | ICD-10-CM | POA: Diagnosis not present

## 2021-05-24 DIAGNOSIS — H5213 Myopia, bilateral: Secondary | ICD-10-CM | POA: Diagnosis not present

## 2021-05-24 DIAGNOSIS — H524 Presbyopia: Secondary | ICD-10-CM | POA: Diagnosis not present

## 2021-05-24 DIAGNOSIS — H52203 Unspecified astigmatism, bilateral: Secondary | ICD-10-CM | POA: Diagnosis not present

## 2021-05-24 DIAGNOSIS — H25813 Combined forms of age-related cataract, bilateral: Secondary | ICD-10-CM | POA: Diagnosis not present

## 2021-05-24 DIAGNOSIS — H401121 Primary open-angle glaucoma, left eye, mild stage: Secondary | ICD-10-CM | POA: Diagnosis not present

## 2021-05-24 DIAGNOSIS — H2513 Age-related nuclear cataract, bilateral: Secondary | ICD-10-CM | POA: Diagnosis not present

## 2022-03-22 ENCOUNTER — Ambulatory Visit
Admission: RE | Admit: 2022-03-22 | Discharge: 2022-03-22 | Disposition: A | Discharge status: Home / Self Care | Payer: Medicare HMO | Source: Ambulatory Visit | Attending: Geriatric Medicine | Admitting: Geriatric Medicine

## 2022-03-22 ENCOUNTER — Other Ambulatory Visit: Payer: Self-pay | Admitting: Geriatric Medicine

## 2022-03-22 DIAGNOSIS — R053 Chronic cough: Secondary | ICD-10-CM

## 2023-05-07 ENCOUNTER — Encounter: Payer: Self-pay | Admitting: Internal Medicine

## 2023-06-03 ENCOUNTER — Ambulatory Visit (AMBULATORY_SURGERY_CENTER)

## 2023-06-03 VITALS — Ht 71.0 in | Wt 225.0 lb

## 2023-06-03 DIAGNOSIS — Z860100 Personal history of colon polyps, unspecified: Secondary | ICD-10-CM

## 2023-06-03 MED ORDER — NA SULFATE-K SULFATE-MG SULF 17.5-3.13-1.6 GM/177ML PO SOLN: 1.0000 | Freq: Once | ORAL | 0 refills | Status: AC

## 2023-06-03 NOTE — Progress Notes (Signed)

## 2023-06-18 ENCOUNTER — Encounter: Payer: Self-pay | Admitting: Internal Medicine

## 2023-06-24 ENCOUNTER — Encounter (HOSPITAL_COMMUNITY): Payer: Self-pay

## 2023-07-01 ENCOUNTER — Ambulatory Visit: Admitting: Internal Medicine

## 2023-07-01 ENCOUNTER — Encounter: Payer: Self-pay | Admitting: Internal Medicine

## 2023-07-01 VITALS — BP 127/79 | HR 70 | Temp 97.4°F | Resp 13 | Ht 71.0 in | Wt 225.0 lb

## 2023-07-01 DIAGNOSIS — K648 Other hemorrhoids: Secondary | ICD-10-CM

## 2023-07-01 DIAGNOSIS — D124 Benign neoplasm of descending colon: Secondary | ICD-10-CM | POA: Diagnosis not present

## 2023-07-01 DIAGNOSIS — K5730 Diverticulosis of large intestine without perforation or abscess without bleeding: Secondary | ICD-10-CM

## 2023-07-01 DIAGNOSIS — Z1211 Encounter for screening for malignant neoplasm of colon: Secondary | ICD-10-CM | POA: Diagnosis present

## 2023-07-01 DIAGNOSIS — Z860100 Personal history of colon polyps, unspecified: Secondary | ICD-10-CM

## 2023-07-01 NOTE — Progress Notes (Signed)
 Called to room to assist during endoscopic procedure.  Patient ID and intended procedure confirmed with present staff. Received instructions for my participation in the procedure from the performing physician.

## 2023-07-01 NOTE — Progress Notes (Signed)
 Pt's states no medical or surgical changes since previsit or office visit.

## 2023-07-01 NOTE — Op Note (Signed)
 [facility] Patient Name: Lisa Blair Procedure Date: 07/01/2023 11:09 AM MRN: [id number] Endoscopist: Murel Arlington. Elvin Hammer , MD, [id number] Age: 73 Referring MD:  Date of Birth: [birth year] Gender: Female Account #: [id number] Procedure:                Colonoscopy with cold snare polypectomy x 2 Indications:              High risk colon cancer surveillance: Personal                            history of multiple (3 or more) adenomas (2016).                            Also, colonoscopy in [place]  prior to 2016 (no                            details) Medicines:                Monitored Anesthesia Care Procedure:                Pre-Anesthesia Assessment:                           - Prior to the procedure, a History and Physical                            was performed, and patient medications and                            allergies were reviewed. The patient's tolerance of                            previous anesthesia was also reviewed. The risks                            and benefits of the procedure and the sedation                            options and risks were discussed with the patient.                            All questions were answered, and informed consent                            was obtained. Prior Anticoagulants: The patient has                            taken no anticoagulant or antiplatelet agents.                            After reviewing the risks and benefits, the patient                            was deemed in satisfactory condition to undergo the  procedure.                           After obtaining informed consent, the colonoscope                            was passed under direct vision. Throughout the                            procedure, the patient's blood pressure, pulse, and                            oxygen saturations were monitored continuously. The                            CF [id number] #[id number] was introduced  through the anus                            and advanced to the the cecum, identified by                            appendiceal orifice and ileocecal valve. The                            ileocecal valve, appendiceal orifice, and rectum                            were photographed. The quality of the bowel                            preparation was excellent. The colonoscopy was                            performed without difficulty. The patient tolerated                            the procedure well. The bowel preparation used was                            SUPREP via split dose instruction. Scope In: 11:21:07 AM Scope Out: 11:35:59 AM Scope Withdrawal Time: 0 hours 11 minutes 2 seconds  Total Procedure Duration: 0 hours 14 minutes 52 seconds  Findings:                 Two polyps were found in the descending colon. The                            polyps were 1 to 2 mm in size. These polyps were                            removed with a cold snare. Resection and retrieval                            were complete.  Multiple diverticula were found in the left colon.                           Internal hemorrhoids were found during retroflexion.                           The exam was otherwise without abnormality on                            direct and retroflexion views. Complications:            No immediate complications. Estimated blood loss:                            None. Estimated Blood Loss:     Estimated blood loss: none. Impression:               - Two 1 to 2 mm polyps in the descending colon,                            removed with a cold snare. Resected and retrieved.                           - Diverticulosis in the left colon.                           - Internal hemorrhoids.                           - The examination was otherwise normal on direct                            and retroflexion views. Recommendation:           - Repeat colonoscopy is  not recommended for                            surveillance.                           - Patient has a contact number available for                            emergencies. The signs and symptoms of potential                            delayed complications were discussed with the                            patient. Return to normal activities tomorrow.                            Written discharge instructions were provided to the                            patient.                           -  Resume previous diet.                           - Continue present medications.                           - Await pathology results. Murel Arlington. Elvin Hammer, MD 07/01/2023 11:42:08 AM This report has been signed electronically.

## 2023-07-01 NOTE — Progress Notes (Signed)
 Pt sedate, gd SR's, VSS, report to RN

## 2023-07-01 NOTE — Patient Instructions (Signed)

## 2023-07-01 NOTE — Progress Notes (Signed)
 HISTORY OF PRESENT ILLNESS:  Lisa Blair is a 73 y.o. female with a history of multiple adenomatous colon polyps.  Previous colonoscopy 2016.  Colonoscopy prior to 2016 in [place] .  Now for surveillance  REVIEW OF SYSTEMS:  All non-GI ROS negative except for  Past Medical History:  Diagnosis Date   Allergy    Blood transfusion without reported diagnosis 02/13/1996   due to fibroids   Glaucoma    in left eye   Hypertension    Loss of bladder control     Past Surgical History:  Procedure Laterality Date   ABDOMINAL HYSTERECTOMY  1998   One ovary intact; DUB; fibroids   COLONOSCOPY  01/2015   DILATION AND CURETTAGE OF UTERUS      Social History Lisa Blair  reports that she has never smoked. She has never used smokeless tobacco. She reports current alcohol use. She reports that she does not use drugs.  family history includes Cancer in her sister; Cancer (age of onset: 79) in her father; Multiple sclerosis in her son.  Allergies  Allergen Reactions   Caffeine Other (See Comments)    Can't sleep   Aleve [Naproxen Sodium] Other (See Comments)    LIGHTHEADNESS/can't function past 2 days   Codeine Other (See Comments)    SEVERE HEADACHES   Ibuprofen Other (See Comments)    UNABLE TO SLEEP   Lisinopril  Cough   Scallops [Shellfish Allergy] Other (See Comments)    Bad headaches   Soy Allergy (Obsolete) Other (See Comments)    Bad headaches       PHYSICAL EXAMINATION: Vital signs: BP 133/77   Pulse 83   Temp (!) 97.4 F (36.3 C)   Resp (!) 0   Ht 5\' 11"  (1.803 m)   Wt 225 lb (102.1 kg)   SpO2 (!) 86%   BMI 31.38 kg/m  General: Well-developed, well-nourished, no acute distress HEENT: Sclerae are anicteric, conjunctiva pink. Oral mucosa intact Lungs: Clear Heart: Regular Abdomen: soft, nontender, nondistended, no obvious ascites, no peritoneal signs, normal bowel sounds. No organomegaly. Extremities: No edema Psychiatric: alert and oriented x3.  Cooperative     ASSESSMENT:  History of multiple adenomatous colon polyps   PLAN:  Surveillance colonoscopy

## 2023-07-02 ENCOUNTER — Telehealth: Payer: Self-pay | Admitting: Lactation Services

## 2023-07-02 NOTE — Telephone Encounter (Signed)
  Follow up Call-     07/01/2023   10:29 AM  Call back number  Post procedure Call Back phone  # [phone]  Permission to leave phone message Yes     Patient questions:  Do you have a fever, pain , or abdominal swelling? No. Pain Score  0 *  Have you tolerated food without any problems? Yes.    Have you been able to return to your normal activities? Yes.    Do you have any questions about your discharge instructions: Diet   No. Medications  No. Follow up visit  No.  Do you have questions or concerns about your Care? No.  Actions: * If pain score is 4 or above: No action needed, pain <4.

## 2023-07-03 ENCOUNTER — Ambulatory Visit: Payer: Self-pay | Admitting: Internal Medicine

## 2023-07-03 LAB — SURGICAL PATHOLOGY
